# Patient Record
Sex: Female | Born: 1990 | Race: Black or African American | Hispanic: No | Marital: Single | Smoking: Former smoker
Health system: Southern US, Community
[De-identification: ages and names within clinical notes are randomized; demographics above are authoritative.]

## PROBLEM LIST (undated history)

## (undated) ENCOUNTER — Inpatient Hospital Stay (HOSPITAL_COMMUNITY): Payer: Self-pay

## (undated) DIAGNOSIS — R51 Headache: Secondary | ICD-10-CM

## (undated) DIAGNOSIS — K529 Noninfective gastroenteritis and colitis, unspecified: Secondary | ICD-10-CM

## (undated) DIAGNOSIS — R519 Headache, unspecified: Secondary | ICD-10-CM

## (undated) HISTORY — PX: NO PAST SURGERIES: SHX2092

---

## 2014-04-25 NOTE — L&D Delivery Note (Cosign Needed)
Delivery Note At 4:16 AM a viable female was delivered via Vaginal, Spontaneous Delivery (Presentation: Right Occiput Anterior).  APGAR: 8, 9; weight 6 lb 2.6 oz (2795 g).  Infant dried at perineum; cord clamped and cut by mother of pt at perineum due to short cord length. Once cord cut, infant to pt's abd and stimulated. Placenta status: Intact, Spontaneous.  Cord: 3 vessels with the following complications: Short cord subjectively; approx 12" in length.  Anesthesia: None  Episiotomy: None Lacerations: None Est. Blood Loss (mL): 200  Mom to postpartum.  Baby to Couplet care / Skin to Skin.   Cam HaiSHAW, Mikyla Schachter CNM 02/19/2015, 4:29 AM

## 2014-10-23 ENCOUNTER — Other Ambulatory Visit (HOSPITAL_COMMUNITY): Payer: Self-pay | Admitting: Physician Assistant

## 2014-10-23 DIAGNOSIS — Z3689 Encounter for other specified antenatal screening: Secondary | ICD-10-CM

## 2014-10-23 DIAGNOSIS — Z3A23 23 weeks gestation of pregnancy: Secondary | ICD-10-CM

## 2014-10-23 LAB — OB RESULTS CONSOLE HEPATITIS B SURFACE ANTIGEN: Hepatitis B Surface Ag: NEGATIVE

## 2014-10-23 LAB — OB RESULTS CONSOLE ABO/RH: RH Type: POSITIVE

## 2014-10-23 LAB — OB RESULTS CONSOLE HIV ANTIBODY (ROUTINE TESTING): HIV: NONREACTIVE

## 2014-10-23 LAB — OB RESULTS CONSOLE RPR: RPR: NONREACTIVE

## 2014-10-23 LAB — OB RESULTS CONSOLE RUBELLA ANTIBODY, IGM: RUBELLA: IMMUNE

## 2014-10-23 LAB — OB RESULTS CONSOLE ANTIBODY SCREEN: ANTIBODY SCREEN: NEGATIVE

## 2014-10-31 ENCOUNTER — Encounter (HOSPITAL_COMMUNITY): Payer: Self-pay

## 2014-10-31 ENCOUNTER — Other Ambulatory Visit (HOSPITAL_COMMUNITY): Payer: Self-pay | Admitting: Physician Assistant

## 2014-10-31 ENCOUNTER — Inpatient Hospital Stay (HOSPITAL_COMMUNITY)
Admission: AD | Admit: 2014-10-31 | Discharge: 2014-10-31 | Disposition: A | Discharge status: Home / Self Care | Payer: Medicaid Other | Source: Ambulatory Visit | Attending: Obstetrics and Gynecology | Admitting: Obstetrics and Gynecology

## 2014-10-31 ENCOUNTER — Ambulatory Visit (HOSPITAL_COMMUNITY)
Admission: RE | Admit: 2014-10-31 | Discharge: 2014-10-31 | Disposition: A | Discharge status: Home / Self Care | Payer: Medicaid Other | Source: Ambulatory Visit | Attending: Physician Assistant | Admitting: Physician Assistant

## 2014-10-31 DIAGNOSIS — Z3A23 23 weeks gestation of pregnancy: Secondary | ICD-10-CM

## 2014-10-31 DIAGNOSIS — Z3689 Encounter for other specified antenatal screening: Secondary | ICD-10-CM

## 2014-10-31 DIAGNOSIS — Z36 Encounter for antenatal screening of mother: Secondary | ICD-10-CM | POA: Diagnosis not present

## 2014-10-31 DIAGNOSIS — O219 Vomiting of pregnancy, unspecified: Secondary | ICD-10-CM | POA: Diagnosis not present

## 2014-10-31 DIAGNOSIS — O283 Abnormal ultrasonic finding on antenatal screening of mother: Secondary | ICD-10-CM | POA: Insufficient documentation

## 2014-10-31 DIAGNOSIS — O212 Late vomiting of pregnancy: Secondary | ICD-10-CM | POA: Diagnosis not present

## 2014-10-31 LAB — URINALYSIS, ROUTINE W REFLEX MICROSCOPIC
BILIRUBIN URINE: NEGATIVE
Glucose, UA: NEGATIVE mg/dL
Hgb urine dipstick: NEGATIVE
Ketones, ur: 40 mg/dL — AB
LEUKOCYTES UA: NEGATIVE
NITRITE: NEGATIVE
Protein, ur: NEGATIVE mg/dL
Specific Gravity, Urine: >1.03 — ABNORMAL HIGH (ref 1.005–1.030)
Urobilinogen, UA: 1 mg/dL (ref 0.0–1.0)
pH: 6 (ref 5.0–8.0)

## 2014-10-31 MED ORDER — PROMETHAZINE HCL 25 MG PO TABS: 25.0000 mg | ORAL_TABLET | Freq: Four times a day (QID) | ORAL | Status: DC | PRN

## 2014-10-31 NOTE — Discharge Instructions (Signed)

## 2014-10-31 NOTE — MAU Note (Signed)
Pt presents via EMS complaining of vomiting. States she vomited 4 times this am and has not vomited since calling EMS. States the vomiting is normal for her but was concerned about the blood. Denies vaginal bleeding or discharge. Reports good fetal movement. Denies pain

## 2014-10-31 NOTE — MAU Provider Note (Signed)
  History    Casey Quinn is a 24 y.o. [phone] at 3837w6d presenting to MAU with c/o emesis. Pt had four episodes of emesis over 1h: 1. clear liquid, 2. yellow liquid,  3. liquid with two dime sized chunks of dark blood, 4. liquid with small streaks of dark blood. Endorses sweating, "feeling hot and tired", and nausea 15min prior to and during episodes. Pt had not eaten all day and was seen at clinic this morning for regular prenatal care. She denies problems this pregnancy or in previous pregnancies. Denies other medical problems, specifically no h/o or S/S of GERD or gastric ulcers. Denies vaginal bleeding, LOF, or contractions. No change in fetal movements.   CSN: [id number]  Arrival date and time: 10/31/14 1221   None     Chief Complaint  Patient presents with  . Emesis   HPI   History reviewed. No pertinent past medical history.  History reviewed. No pertinent past surgical history.  History reviewed. No pertinent family history.  History  Substance Use Topics  . Smoking status: Never Smoker   . Smokeless tobacco: Not on file  . Alcohol Use: No    Allergies: No Known Allergies  No prescriptions prior to admission    Review of Systems  Constitutional: Positive for malaise/fatigue and diaphoresis. Negative for fever and chills.  HENT: Negative for hearing loss and tinnitus.   Eyes: Negative for blurred vision and double vision.  Respiratory: Negative for shortness of breath.   Cardiovascular: Negative for chest pain and palpitations.  Gastrointestinal: Positive for nausea and vomiting. Negative for heartburn, abdominal pain, diarrhea, constipation and blood in stool.  Genitourinary: Negative for dysuria and frequency.   Physical Exam  fht- Category 1. Reassuring. UC- No activity.  Blood pressure 109/70, pulse 86, temperature 98 F (36.7 C), temperature source Oral, resp. rate 18.  Physical Exam  Constitutional: She is oriented to person, place, and time. She appears  well-developed and well-nourished. No distress.  HENT:  Head: Normocephalic.  Cardiovascular: Normal rate, regular rhythm, normal heart sounds and intact distal pulses.  Exam reveals no gallop and no friction rub.   No murmur heard. Respiratory: Effort normal and breath sounds normal. No respiratory distress. She has no wheezes. She has no rales.  GI: Soft. Bowel sounds are normal. She exhibits no distension and no mass. There is no tenderness. There is no guarding.  Neurological: She is alert and oriented to person, place, and time.  Skin: Skin is warm and dry. She is not diaphoretic.    MAU Course  Procedures  MDM   Assessment and Plan  Nausea and vomiting in pregnancy.  Phenergan 25mg  po and d/c to home.   Arrington Bencomo Grissett 10/31/2014, 1:42 PM

## 2014-11-11 ENCOUNTER — Ambulatory Visit (HOSPITAL_COMMUNITY): Payer: Medicaid Other

## 2014-11-11 ENCOUNTER — Other Ambulatory Visit (HOSPITAL_COMMUNITY): Payer: Medicaid Other

## 2014-11-20 ENCOUNTER — Other Ambulatory Visit (HOSPITAL_COMMUNITY): Payer: Self-pay | Admitting: Physician Assistant

## 2014-12-16 LAB — OB RESULTS CONSOLE GC/CHLAMYDIA
CHLAMYDIA, DNA PROBE: NEGATIVE
GC PROBE AMP, GENITAL: NEGATIVE

## 2014-12-24 ENCOUNTER — Encounter (HOSPITAL_COMMUNITY): Payer: Self-pay | Admitting: *Deleted

## 2014-12-24 ENCOUNTER — Inpatient Hospital Stay (HOSPITAL_COMMUNITY)
Admission: AD | Admit: 2014-12-24 | Discharge: 2014-12-24 | Disposition: A | Discharge status: Home / Self Care | Payer: Medicaid Other | Source: Ambulatory Visit | Attending: Obstetrics & Gynecology | Admitting: Obstetrics & Gynecology

## 2014-12-24 DIAGNOSIS — Z87891 Personal history of nicotine dependence: Secondary | ICD-10-CM | POA: Diagnosis not present

## 2014-12-24 DIAGNOSIS — N760 Acute vaginitis: Secondary | ICD-10-CM | POA: Diagnosis not present

## 2014-12-24 DIAGNOSIS — N949 Unspecified condition associated with female genital organs and menstrual cycle: Secondary | ICD-10-CM

## 2014-12-24 DIAGNOSIS — R109 Unspecified abdominal pain: Secondary | ICD-10-CM | POA: Diagnosis present

## 2014-12-24 DIAGNOSIS — O23593 Infection of other part of genital tract in pregnancy, third trimester: Secondary | ICD-10-CM

## 2014-12-24 DIAGNOSIS — R102 Pelvic and perineal pain: Secondary | ICD-10-CM | POA: Insufficient documentation

## 2014-12-24 DIAGNOSIS — Z3A31 31 weeks gestation of pregnancy: Secondary | ICD-10-CM | POA: Insufficient documentation

## 2014-12-24 DIAGNOSIS — O26893 Other specified pregnancy related conditions, third trimester: Secondary | ICD-10-CM

## 2014-12-24 LAB — WET PREP, GENITAL
Clue Cells Wet Prep HPF POC: NONE SEEN
Trich, Wet Prep: NONE SEEN
YEAST WET PREP: NONE SEEN

## 2014-12-24 LAB — URINALYSIS, ROUTINE W REFLEX MICROSCOPIC
BILIRUBIN URINE: NEGATIVE
GLUCOSE, UA: NEGATIVE mg/dL
Hgb urine dipstick: NEGATIVE
Ketones, ur: NEGATIVE mg/dL
Leukocytes, UA: NEGATIVE
Nitrite: NEGATIVE
PROTEIN: NEGATIVE mg/dL
Specific Gravity, Urine: 1.015 (ref 1.005–1.030)
UROBILINOGEN UA: 1 mg/dL (ref 0.0–1.0)
pH: 7 (ref 5.0–8.0)

## 2014-12-24 MED ORDER — TERCONAZOLE 0.4 % VA CREA: 1.0000 | TOPICAL_CREAM | Freq: Every day | VAGINAL | Status: DC

## 2014-12-24 NOTE — MAU Provider Note (Signed)
History     CSN: [id number]  Arrival date and time: 12/24/14 1228   First Provider Initiated Contact with Patient 12/24/14 1352      Chief Complaint  Patient presents with  . Abdominal Pain   HPI   Ms.Casey Quinn is a 24 y.o. female [phone] at [redacted]w[redacted]d presenting to MAU with abdominal pain, vaginal pressure and vaginal itching. The vaginal pressure started last night; she has been on her feet a lot during the day. She has had no recent intercourse.  The vaginal itching is constant, she feels that the itching is on the inside of her vagina. She has not tried anything over the counter. She denies abnormal discharge, however she does attest to white vaginal discharge.   She has not been wearing a pregnancy support belt. She has had to stop and take breaks after walking for periods of time because the vaginal pressure is so intense.  She has no history of preterm labor or preterm deliveries. This pregnancy has been uncomplicated.   Denies bleeding or leaking of fluid  + fetal movement.   OB History    Gravida Para Term Preterm AB TAB SAB Ectopic Multiple Living   4 3 3       3       History reviewed. No pertinent past medical history.  History reviewed. No pertinent past surgical history.  History reviewed. No pertinent family history.  Social History  Substance Use Topics  . Smoking status: Former Games developer  . Smokeless tobacco: None  . Alcohol Use: No    Allergies: No Known Allergies  Prescriptions prior to admission  Medication Sig Dispense Refill Last Dose  . Pediatric Multivit-Minerals-C (FLINTSTONES GUMMIES PO) Take 3 each by mouth daily.   Past Week at Unknown time  . promethazine (PHENERGAN) 25 MG tablet Take 1 tablet (25 mg total) by mouth every 6 (six) hours as needed for nausea or vomiting. (Patient not taking: Reported on 12/24/2014) 30 tablet 0 Not Taking at Unknown time   Results for orders placed or performed during the hospital encounter of 12/24/14 (from the  past 48 hour(s))  Urinalysis, Routine w reflex microscopic (not at [facility])     Status: None   Collection Time: 12/24/14 12:45 PM  Result Value Ref Range   Color, Urine YELLOW YELLOW   APPearance CLEAR CLEAR   Specific Gravity, Urine 1.015 1.005 - 1.030   pH 7.0 5.0 - 8.0   Glucose, UA NEGATIVE NEGATIVE mg/dL   Hgb urine dipstick NEGATIVE NEGATIVE   Bilirubin Urine NEGATIVE NEGATIVE   Ketones, ur NEGATIVE NEGATIVE mg/dL   Protein, ur NEGATIVE NEGATIVE mg/dL   Urobilinogen, UA 1.0 0.0 - 1.0 mg/dL   Nitrite NEGATIVE NEGATIVE   Leukocytes, UA NEGATIVE NEGATIVE    Comment: MICROSCOPIC NOT DONE ON URINES WITH NEGATIVE PROTEIN, BLOOD, LEUKOCYTES, NITRITE, OR GLUCOSE <1000 mg/dL.  Wet prep, genital     Status: Abnormal   Collection Time: 12/24/14  2:23 PM  Result Value Ref Range   Yeast Wet Prep HPF POC NONE SEEN NONE SEEN   Trich, Wet Prep NONE SEEN NONE SEEN   Clue Cells Wet Prep HPF POC NONE SEEN NONE SEEN   WBC, Wet Prep HPF POC FEW (A) NONE SEEN    Comment: MANY BACTERIA SEEN    Review of Systems  Constitutional: Negative for fever and chills.  Gastrointestinal: Negative for nausea, vomiting and abdominal pain.  Genitourinary: Negative for dysuria, urgency, frequency and hematuria.  Denies vaginal discharge.    Physical Exam   Blood pressure 106/67, pulse 94, temperature 98.4 F (36.9 C), temperature source Oral, resp. rate 16, height  (1.6 m), weight 78.926 kg (174 lb).  Physical Exam  Constitutional: She is oriented to person, place, and time. She appears well-developed and well-nourished. No distress.  HENT:  Head: Normocephalic.  Eyes: Pupils are equal, round, and reactive to light.  Neck: Neck supple.  Respiratory: Effort normal.  GI: Soft.  Genitourinary:  Wet prep and GC collected without speculum. External genitalia normal without discharge or odor.  Bimanual exam: Cervix closed  Chaperone present for exam.  Musculoskeletal: Normal range of motion.   Neurological: She is alert and oriented to person, place, and time.  Skin: Skin is warm. She is not diaphoretic.  Psychiatric: Her behavior is normal.     Fetal Tracing: Baseline: 120 bpm Variability: Moderate  Accelerations: 15x15 Decelerations: quick variables  Toco: quiet    MAU Course  Procedures  None  MDM   Assessment and Plan   A:  1. Vaginitis affecting pregnancy in third trimester, antepartum   2. Pelvic pressure in pregnancy, antepartum, third trimester    P:  Discharge home in stable condition Pelvic rest Preterm labor precautions RX: Terazol Return to MAU if symptoms worsen   Follow up with the HD as scheduled  Pregnancy support belt recommended; discussed options on where to purchase.    Duane Lope, NP 12/24/2014 1:55 PM

## 2014-12-24 NOTE — MAU Note (Signed)
Feels a lot of pressure in vagina which started last night, denies vaginal bleeding, normal discharge no odor, bladder pressure, no problems during the pregnancy.

## 2014-12-25 LAB — GC/CHLAMYDIA PROBE AMP (~~LOC~~) NOT AT ARMC
Chlamydia: NEGATIVE
NEISSERIA GONORRHEA: NEGATIVE

## 2015-01-22 ENCOUNTER — Encounter (HOSPITAL_COMMUNITY): Payer: Self-pay | Admitting: *Deleted

## 2015-01-22 ENCOUNTER — Inpatient Hospital Stay (HOSPITAL_COMMUNITY)
Admission: AD | Admit: 2015-01-22 | Discharge: 2015-01-22 | Disposition: A | Discharge status: Home / Self Care | Payer: Medicaid Other | Source: Ambulatory Visit | Attending: Obstetrics & Gynecology | Admitting: Obstetrics & Gynecology

## 2015-01-22 DIAGNOSIS — N898 Other specified noninflammatory disorders of vagina: Secondary | ICD-10-CM | POA: Diagnosis present

## 2015-01-22 DIAGNOSIS — O26893 Other specified pregnancy related conditions, third trimester: Secondary | ICD-10-CM | POA: Insufficient documentation

## 2015-01-22 DIAGNOSIS — Z3A35 35 weeks gestation of pregnancy: Secondary | ICD-10-CM | POA: Diagnosis not present

## 2015-01-22 DIAGNOSIS — Z87891 Personal history of nicotine dependence: Secondary | ICD-10-CM | POA: Diagnosis not present

## 2015-01-22 HISTORY — DX: Headache: R51

## 2015-01-22 HISTORY — DX: Headache, unspecified: R51.9

## 2015-01-22 LAB — URINALYSIS, ROUTINE W REFLEX MICROSCOPIC
BILIRUBIN URINE: NEGATIVE
Glucose, UA: NEGATIVE mg/dL
Hgb urine dipstick: NEGATIVE
Ketones, ur: NEGATIVE mg/dL
Leukocytes, UA: NEGATIVE
Nitrite: NEGATIVE
Protein, ur: NEGATIVE mg/dL
UROBILINOGEN UA: 0.2 mg/dL (ref 0.0–1.0)
pH: 7 (ref 5.0–8.0)

## 2015-01-22 NOTE — MAU Provider Note (Signed)
History   [id number]   Chief Complaint  Patient presents with  . Vaginal Discharge    HPI Casey Quinn is a 24 y.o. female  [phone] at [redacted]w[redacted]d IUP here with report of passing thick, "snot" appearing discharge three days ago.  Denies contractions or vaginal bleeding.  Here to see if dilated.  Denies any problems in pregnancy.  [facility]    No LMP recorded. Patient is pregnant.  OB History  Gravida Para Term Preterm AB SAB TAB Ectopic Multiple Living  # Outcome Date GA Lbr Len/2nd Weight Sex Delivery Anes PTL Lv  4 Current           3 Term      Vag-Spont   Y  2 Term      Vag-Spont   Y  1 Term      Vag-Spont   Y      Past Medical History  Diagnosis Date  . Headache     Family History  Problem Relation Age of Onset  . Alcohol abuse Neg Hx   . Arthritis Neg Hx   . Asthma Neg Hx   . Birth defects Neg Hx   . Cancer Neg Hx   . COPD Neg Hx   . Depression Neg Hx   . Diabetes Neg Hx   . Drug abuse Neg Hx   . Early death Neg Hx   . Hearing loss Neg Hx   . Heart disease Neg Hx   . Hyperlipidemia Neg Hx   . Hypertension Neg Hx   . Kidney disease Neg Hx   . Learning disabilities Neg Hx   . Mental illness Neg Hx   . Mental retardation Neg Hx   . Miscarriages / Stillbirths Neg Hx   . Stroke Neg Hx   . Vision loss Neg Hx   . Varicose Veins Neg Hx     Social History   Social History  . Marital Status: Single    Spouse Name: N/A  . Number of Children: N/A  . Years of Education: N/A   Social History Main Topics  . Smoking status: Former Games developer  . Smokeless tobacco: None  . Alcohol Use: No  . Drug Use: No  . Sexual Activity: Not Currently    Birth Control/ Protection: None   Other Topics Concern  . None   Social History Narrative    No Known Allergies  No current facility-administered medications on file prior to encounter.   Current Outpatient Prescriptions on File Prior to Encounter  Medication Sig Dispense Refill   . Pediatric Multivit-Minerals-C (FLINTSTONES GUMMIES PO) Take 2 each by mouth daily.     . promethazine (PHENERGAN) 25 MG tablet Take 1 tablet (25 mg total) by mouth every 6 (six) hours as needed for nausea or vomiting. (Patient not taking: Reported on 12/24/2014) 30 tablet 0  . terconazole (TERAZOL 7) 0.4 % vaginal cream Place 1 applicator vaginally at bedtime. (Patient not taking: Reported on 01/22/2015) 45 g 0     Review of Systems  Gastrointestinal: Negative for abdominal pain.  Genitourinary: Positive for vaginal discharge. Negative for vaginal bleeding.  All other systems reviewed and are negative.    Physical Exam   Filed Vitals:   01/22/15 1527 01/22/15 1625  BP: 117/56 112/65  Pulse: 79 79  Temp: 98.5 F (36.9 C)   TempSrc: Oral   Resp: 18 18  Height:   (1.6 m)   Weight: 79.652 kg (175 lb 9.6 oz)     Physical Exam  Constitutional: She is oriented to person, place, and time. She appears well-developed and well-nourished.  HENT:  Head: Normocephalic.  Neck: Normal range of motion. Neck supple.  Cardiovascular: Normal rate, regular rhythm and normal heart sounds.   Respiratory: Effort normal and breath sounds normal. No respiratory distress.  GI: Soft. There is no tenderness.  Genitourinary: No bleeding in the vagina. Vaginal discharge (mucusy) found.  Musculoskeletal: Normal range of motion. She exhibits no edema.  Neurological: She is alert and oriented to person, place, and time.  Skin: Skin is warm and dry.   Dilation: Closed (Ext ox 1 cm) Exam by:: Margarita Mail, CNM  MAU Course  Procedures  Assessment and Plan  24 y.o. [phone] at [redacted]w[redacted]d IUP  Reactive NST  Plan: Discharge home Provided reassurance Showed visual of cervix - mucus plug Reviewed warning signs of pregnancy   Marlis Edelson, CNM 01/22/2015 5:26 PM

## 2015-01-22 NOTE — MAU Note (Addendum)
Pt reports she lost her mucus plug. Went to the  [facility] today and they sent her straight here. Pt denies any pain or contractions.

## 2015-01-22 NOTE — MAU Note (Signed)
Urine in lab 

## 2015-02-11 LAB — OB RESULTS CONSOLE GBS: GBS: NEGATIVE

## 2015-02-19 ENCOUNTER — Inpatient Hospital Stay (HOSPITAL_COMMUNITY): Payer: Medicaid Other

## 2015-02-19 ENCOUNTER — Encounter (HOSPITAL_COMMUNITY): Payer: Self-pay | Admitting: *Deleted

## 2015-02-19 ENCOUNTER — Inpatient Hospital Stay (HOSPITAL_COMMUNITY)
Admission: AD | Admit: 2015-02-19 | Discharge: 2015-02-20 | DRG: 775 | Disposition: A | Discharge status: Home / Self Care | Payer: Medicaid Other | Source: Ambulatory Visit | Attending: Obstetrics & Gynecology | Admitting: Obstetrics & Gynecology

## 2015-02-19 DIAGNOSIS — O9902 Anemia complicating childbirth: Secondary | ICD-10-CM | POA: Diagnosis present

## 2015-02-19 DIAGNOSIS — Z87891 Personal history of nicotine dependence: Secondary | ICD-10-CM

## 2015-02-19 DIAGNOSIS — Z3A39 39 weeks gestation of pregnancy: Secondary | ICD-10-CM

## 2015-02-19 DIAGNOSIS — Z141 Cystic fibrosis carrier: Secondary | ICD-10-CM

## 2015-02-19 DIAGNOSIS — D649 Anemia, unspecified: Secondary | ICD-10-CM | POA: Diagnosis present

## 2015-02-19 DIAGNOSIS — IMO0001 Reserved for inherently not codable concepts without codable children: Secondary | ICD-10-CM

## 2015-02-19 DIAGNOSIS — O288 Other abnormal findings on antenatal screening of mother: Secondary | ICD-10-CM

## 2015-02-19 LAB — CBC
HCT: 28.7 % — ABNORMAL LOW (ref 36.0–46.0)
Hemoglobin: 9.3 g/dL — ABNORMAL LOW (ref 12.0–15.0)
MCH: 22.5 pg — AB (ref 26.0–34.0)
MCHC: 32.4 g/dL (ref 30.0–36.0)
MCV: 69.3 fL — ABNORMAL LOW (ref 78.0–100.0)
PLATELETS: 224 10*3/uL (ref 150–400)
RBC: 4.14 MIL/uL (ref 3.87–5.11)
RDW: 16.1 % — ABNORMAL HIGH (ref 11.5–15.5)
WBC: 14.3 10*3/uL — AB (ref 4.0–10.5)

## 2015-02-19 LAB — HIV ANTIBODY (ROUTINE TESTING W REFLEX): HIV Screen 4th Generation wRfx: NONREACTIVE

## 2015-02-19 LAB — RPR: RPR: NONREACTIVE

## 2015-02-19 MED ORDER — OXYCODONE-ACETAMINOPHEN 5-325 MG PO TABS: 1.0000 | ORAL_TABLET | ORAL | Status: DC | PRN

## 2015-02-19 MED ORDER — PRENATAL MULTIVITAMIN CH
1.0000 | ORAL_TABLET | Freq: Every day | ORAL | Status: DC
  Administered 2015-02-19 – 2015-02-20 (×2): 1 via ORAL
  Filled 2015-02-19 (×2): qty 1

## 2015-02-19 MED ORDER — LIDOCAINE HCL (PF) 1 % IJ SOLN
30.0000 mL | INTRAMUSCULAR | Status: DC | PRN
Start: 2015-02-19 — End: 2015-02-19
  Filled 2015-02-19: qty 30

## 2015-02-19 MED ORDER — EPHEDRINE 5 MG/ML INJ
10.0000 mg | INTRAVENOUS | Status: DC | PRN
  Filled 2015-02-19: qty 2

## 2015-02-19 MED ORDER — LANOLIN HYDROUS EX OINT: TOPICAL_OINTMENT | CUTANEOUS | Status: DC | PRN

## 2015-02-19 MED ORDER — ONDANSETRON HCL 4 MG PO TABS: 4.0000 mg | ORAL_TABLET | ORAL | Status: DC | PRN

## 2015-02-19 MED ORDER — IBUPROFEN 600 MG PO TABS
600.0000 mg | ORAL_TABLET | Freq: Four times a day (QID) | ORAL | Status: DC
  Administered 2015-02-19 – 2015-02-20 (×6): 600 mg via ORAL
  Filled 2015-02-19 (×6): qty 1

## 2015-02-19 MED ORDER — TETANUS-DIPHTH-ACELL PERTUSSIS 5-2.5-18.5 LF-MCG/0.5 IM SUSP: 0.5000 mL | Freq: Once | INTRAMUSCULAR | Status: DC

## 2015-02-19 MED ORDER — SIMETHICONE 80 MG PO CHEW: 80.0000 mg | CHEWABLE_TABLET | ORAL | Status: DC | PRN

## 2015-02-19 MED ORDER — ACETAMINOPHEN 325 MG PO TABS: 650.0000 mg | ORAL_TABLET | ORAL | Status: DC | PRN

## 2015-02-19 MED ORDER — LACTATED RINGERS IV SOLN
INTRAVENOUS | Status: DC
  Administered 2015-02-19: 04:00:00 via INTRAVENOUS

## 2015-02-19 MED ORDER — DIPHENHYDRAMINE HCL 50 MG/ML IJ SOLN
12.5000 mg | INTRAMUSCULAR | Status: DC | PRN
Start: 2015-02-19 — End: 2015-02-19

## 2015-02-19 MED ORDER — OXYCODONE-ACETAMINOPHEN 5-325 MG PO TABS: 2.0000 | ORAL_TABLET | ORAL | Status: DC | PRN

## 2015-02-19 MED ORDER — LACTATED RINGERS IV SOLN: 500.0000 mL | INTRAVENOUS | Status: DC | PRN

## 2015-02-19 MED ORDER — CITRIC ACID-SODIUM CITRATE 334-500 MG/5ML PO SOLN: 30.0000 mL | ORAL | Status: DC | PRN

## 2015-02-19 MED ORDER — ZOLPIDEM TARTRATE 5 MG PO TABS: 5.0000 mg | ORAL_TABLET | Freq: Every evening | ORAL | Status: DC | PRN

## 2015-02-19 MED ORDER — OXYCODONE-ACETAMINOPHEN 5-325 MG PO TABS
1.0000 | ORAL_TABLET | ORAL | Status: DC | PRN
  Administered 2015-02-19 – 2015-02-20 (×3): 1 via ORAL
  Filled 2015-02-19 (×3): qty 1

## 2015-02-19 MED ORDER — FENTANYL 2.5 MCG/ML BUPIVACAINE 1/10 % EPIDURAL INFUSION (WH - ANES): 14.0000 mL/h | INTRAMUSCULAR | Status: DC | PRN

## 2015-02-19 MED ORDER — ONDANSETRON HCL 4 MG/2ML IJ SOLN: 4.0000 mg | Freq: Four times a day (QID) | INTRAMUSCULAR | Status: DC | PRN

## 2015-02-19 MED ORDER — LACTATED RINGERS IV BOLUS (SEPSIS): 500.0000 mL | Freq: Once | INTRAVENOUS | Status: DC

## 2015-02-19 MED ORDER — PROMETHAZINE HCL 25 MG/ML IJ SOLN: 12.5000 mg | Freq: Once | INTRAMUSCULAR | Status: DC

## 2015-02-19 MED ORDER — OXYTOCIN BOLUS FROM INFUSION: 500.0000 mL | INTRAVENOUS | Status: DC

## 2015-02-19 MED ORDER — BENZOCAINE-MENTHOL 20-0.5 % EX AERO
1.0000 "application " | INHALATION_SPRAY | CUTANEOUS | Status: DC | PRN
  Administered 2015-02-19: 1 via TOPICAL
  Filled 2015-02-19: qty 56

## 2015-02-19 MED ORDER — WITCH HAZEL-GLYCERIN EX PADS: 1.0000 "application " | MEDICATED_PAD | CUTANEOUS | Status: DC | PRN

## 2015-02-19 MED ORDER — FENTANYL CITRATE (PF) 100 MCG/2ML IJ SOLN: 100.0000 ug | INTRAMUSCULAR | Status: DC | PRN

## 2015-02-19 MED ORDER — ONDANSETRON HCL 4 MG/2ML IJ SOLN: 4.0000 mg | INTRAMUSCULAR | Status: DC | PRN

## 2015-02-19 MED ORDER — PHENYLEPHRINE 40 MCG/ML (10ML) SYRINGE FOR IV PUSH (FOR BLOOD PRESSURE SUPPORT)
80.0000 ug | PREFILLED_SYRINGE | INTRAVENOUS | Status: DC | PRN
  Filled 2015-02-19: qty 2

## 2015-02-19 MED ORDER — DIBUCAINE 1 % RE OINT: 1.0000 "application " | TOPICAL_OINTMENT | RECTAL | Status: DC | PRN

## 2015-02-19 MED ORDER — SENNOSIDES-DOCUSATE SODIUM 8.6-50 MG PO TABS
2.0000 | ORAL_TABLET | ORAL | Status: DC
  Administered 2015-02-20: 2 via ORAL
  Filled 2015-02-19: qty 2

## 2015-02-19 MED ORDER — DIPHENHYDRAMINE HCL 25 MG PO CAPS: 25.0000 mg | ORAL_CAPSULE | Freq: Four times a day (QID) | ORAL | Status: DC | PRN

## 2015-02-19 MED ORDER — OXYTOCIN 40 UNITS IN LACTATED RINGERS INFUSION - SIMPLE MED
62.5000 mL/h | INTRAVENOUS | Status: DC
  Filled 2015-02-19: qty 1000

## 2015-02-19 NOTE — Lactation Note (Signed)
This note was copied from the chart of Casey Quinn. Lactation Consultation Note  Patient Name: Casey Quinn EAVWU'JToday's Date: 02/19/2015 Reason for consult: Initial assessment Mother is an experienced breastfeeding mother. She reports this baby has latched well and mother denies any concerns. She plans to exclusively breast feed and pump when she returns to work. Only plans to give formula once back at work if needed. Mom made aware of O/P services, breastfeeding support groups, community resources, and our phone # for post-discharge questions. Mother denies any lactation needs, encouraged to have nurses assess latch and patient to request assist as needed. Mother pleasant and agreeable.  Maternal Data Does the patient have breastfeeding experience prior to this delivery?: Yes  Feeding Feeding Type: Breast Fed Length of feed: 20 min  LATCH Score/Interventions                      Lactation Tools Discussed/Used Initiated by:: BDaly RN and Doran HeaterKelly Shemo, RN Date initiated:: 02/19/15   Consult Status Consult Status: PRN    Omar PersonDaly, Beverly M 02/19/2015, 12:45 PM

## 2015-02-19 NOTE — H&P (Signed)
Casey Quinn is a 24 y.o. female [phone] @ 39.5wks presenting for eval of labor. Denies leaking or bldg. 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 Headache    History reviewed. No pertinent past surgical history. Family History: family history is negative for Alcohol abuse, Arthritis, Asthma, Birth defects, Cancer, COPD, Depression, Diabetes, Drug abuse, Early death, Hearing loss, Heart disease, Hyperlipidemia, Hypertension, Kidney disease, Learning disabilities, Mental illness, Mental retardation, Miscarriages / Stillbirths, Stroke, Vision loss, and Varicose Veins. Social History:  reports that she has quit smoking. She does not have any smokeless tobacco history on file. She reports that she does not drink alcohol or use illicit drugs.   Prenatal Transfer Tool  Maternal Diabetes: No Genetic Screening: Declined Maternal Ultrasounds/Referrals: Normal Fetal Ultrasounds or other Referrals:  None Maternal Substance Abuse:  No Significant Maternal Medications:  None Significant Maternal Lab Results:  Lab values include: Group B Strep negative Other Comments:  None  ROS  Dilation: 4 Effacement (%): 90 Station: -2 Exam by:: B Mosca Blood pressure 108/63, pulse 79, temperature 97.6 F (36.4 C), temperature source Oral, resp. rate 18, height 5\' 3"  (1.6 m), weight 81.92 kg (180 lb 9.6 oz). Exam Physical Exam  Constitutional: She is oriented to person, place, and time. She appears well-developed.  HENT:  Head: Normocephalic.  Neck: Normal range of motion.  Cardiovascular: Normal rate.   Respiratory: Effort normal.  GI:  EFM 130s, +accels, no decels, occ mi variables Ctx q 3-5 mins  Musculoskeletal: Normal range of motion.  Neurological:  She is alert and oriented to person, place, and time.  Skin: Skin is warm and dry.  Psychiatric: She has a normal mood and affect. Her behavior is normal. Thought content normal.    BPP obtained w/ initial nonreactive strip: 6/8 (no breathing) but is incomplete assessment due to pt's discomfort  Prenatal labs: ABO, Rh:  A+ Antibody:  neg Rubella:  imm RPR:   NR HBsAg:   neg HIV:   NR GBS:   negative  Assessment/Plan: IUP@39 .5wks Early active labor GBS neg  [facility] Expectant management Plans epidural Anticipate SVD   Cam HaiSHAW, Sebastain Fishbaugh CNM 02/19/2015, 3:23 AM

## 2015-02-19 NOTE — MAU Note (Signed)
Pt c/o contractions every 5 mins since 7pm. Denies LOF or vag bleeding. +FM. Cervix was 1-2cm on Monday.

## 2015-02-20 MED ORDER — IBUPROFEN 600 MG PO TABS: 600.0000 mg | ORAL_TABLET | Freq: Four times a day (QID) | ORAL | Status: DC

## 2015-02-20 NOTE — Discharge Instructions (Signed)

## 2015-02-20 NOTE — Progress Notes (Signed)
[facility]  CSW notes drug screens not sent on baby.  CSW reviewed MOB's PNR, which shows first PNV on 10/20/14 at approximately 22 weeks.  Therefore, CSW is screening out referral as it does not [facility].

## 2015-02-20 NOTE — Discharge Summary (Signed)
OB Discharge Summary  Patient Name: Casey Quinn DOB: 12-01-1990 MRN: [id number]  Date of admission: 02/19/2015 Delivering MD: Cam Hai D   Date of discharge: 02/20/2015  Admitting diagnosis: 39 WEEKS CTX Intrauterine pregnancy: [redacted]w[redacted]d     Secondary diagnosis:Active Problems:   Active labor at term   NSVD (normal spontaneous vaginal delivery)  Additional problems: late prenatal care, anemia, CF carrier, +chlam with neg TOC    Discharge diagnosis: Term Pregnancy Delivered                                                                     Post partum procedures:None  Augmentation: AROM  Complications: None  Hospital course:  Onset of Labor With Vaginal Delivery     24 y.o. yo [id number] at [redacted]w[redacted]d was admitted in Active Labor on 02/19/2015. Patient had an uncomplicated labor course as follows:  Membrane Rupture Time/Date: 4:04 AM ,02/19/2015   Intrapartum Procedures: Episiotomy: None [1]                                         Lacerations:  None [1]  Patient had a delivery of a Viable infant. 02/19/2015  Information for the patient's newborn:  Arakelian, Girl Yvanna [[id number]]  Delivery Method: Vaginal, Spontaneous Delivery (Filed from Delivery Summary)     Pateint had an uncomplicated postpartum course.  She is ambulating, tolerating a regular diet, passing flatus, and urinating well. Patient is discharged home in stable condition on No discharge date for patient encounter.Marland Kitchen    Physical exam  Filed Vitals:   02/19/15 0726 02/19/15 1126 02/19/15 1803 02/20/15 0552  BP: 112/57 118/73 114/67 121/63  Pulse: 73 66 97 78  Temp: 98.3 F (36.8 C) 97.8 F (36.6 C) 97.9 F (36.6 C) 98.4 F (36.9 C)  TempSrc: Oral Oral Oral Oral  Resp: Height:      Weight:       General: alert, cooperative and no distress Lochia: appropriate Uterine Fundus: firm Incision: N/A DVT Evaluation: No evidence of DVT seen on physical exam. Labs: Lab Results  Component Value  Date   WBC 14.3* 02/19/2015   HGB 9.3* 02/19/2015   HCT 28.7* 02/19/2015   MCV 69.3* 02/19/2015   PLT 224 02/19/2015   No flowsheet data found.  Discharge instruction: per After Visit Summary and "Baby and Me Booklet".  Medications:  Current facility-administered medications:  .  acetaminophen (TYLENOL) tablet 650 mg, 650 mg, Oral, Q4H PRN, Arabella Merles, CNM .  benzocaine-Menthol (DERMOPLAST) 20-0.5 % topical spray 1 application, 1 application, Topical, PRN, Arabella Merles, CNM, 1 application at 02/19/15 [phone] .  witch hazel-glycerin (TUCKS) pad 1 application, 1 application, Topical, PRN **AND** dibucaine (NUPERCAINAL) 1 % rectal ointment 1 application, 1 application, Rectal, PRN, Arabella Merles, CNM .  diphenhydrAMINE (BENADRYL) capsule 25 mg, 25 mg, Oral, Q6H PRN, Arabella Merles, CNM .  ibuprofen (ADVIL,MOTRIN) tablet 600 mg, 600 mg, Oral, 4 times per day, Arabella Merles, CNM, 600 mg at 02/20/15 0605 .  lanolin ointment, , Topical, PRN, Arabella Merles, CNM .  ondansetron (ZOFRAN) tablet 4  mg, 4 mg, Oral, Q4H PRN **OR** ondansetron (ZOFRAN) injection 4 mg, 4 mg, Intravenous, Q4H PRN, Arabella MerlesKimberly D Shaw, CNM .  oxyCODONE-acetaminophen (PERCOCET/ROXICET) 5-325 MG per tablet 1 tablet, 1 tablet, Oral, Q4H PRN, Arabella MerlesKimberly D Shaw, CNM, 1 tablet at 02/20/15 0605 .  oxyCODONE-acetaminophen (PERCOCET/ROXICET) 5-325 MG per tablet 2 tablet, 2 tablet, Oral, Q4H PRN, Arabella MerlesKimberly D Shaw, CNM .  prenatal multivitamin tablet 1 tablet, 1 tablet, Oral, Q1200, Arabella MerlesKimberly D Shaw, CNM, 1 tablet at 02/19/15 1122 .  senna-docusate (Senokot-S) tablet 2 tablet, 2 tablet, Oral, Q24H, Arabella MerlesKimberly D Shaw, CNM, 2 tablet at 02/20/15 0029 .  simethicone (MYLICON) chewable tablet 80 mg, 80 mg, Oral, PRN, Arabella MerlesKimberly D Shaw, CNM .  zolpidem (AMBIEN) tablet 5 mg, 5 mg, Oral, QHS PRN, Arabella MerlesKimberly D Shaw, CNM After Visit Meds:    Medication List    TAKE these medications        ibuprofen 600 MG tablet  Commonly known as:   ADVIL,MOTRIN  Take 1 tablet (600 mg total) by mouth every 6 (six) hours.        Diet: routine diet  Activity: Advance as tolerated. Pelvic rest for 6 weeks.   Outpatient follow up:6 weeks Follow up Appt:No future appointments. Follow up visit: No Follow-up on file.  Postpartum contraception: IUD Paragard  Newborn Data: Live born female  Birth Weight: 6 lb 2.6 oz (2795 g) APGAR: 8, 9  Baby Feeding: Breast Disposition:home with mother or rooming for possible need for phototherapy   02/20/2015 Jamelle HaringHillary M Fitzgerald, MD  [facility], PGY-1   I have seen and examined this patient and agree the above assessment.  Respiratory effort normal, lochia appropriate, legs negative,  pain level normal.  CRESENZO-DISHMAN,Flavius Repsher 02/26/2015 8:45 AM

## 2015-02-23 LAB — TYPE AND SCREEN
ABO/RH(D): A POS
ANTIBODY SCREEN: POSITIVE
DAT, IgG: NEGATIVE
PT AG TYPE: NEGATIVE
Unit division: 0
Unit division: 0

## 2016-02-11 ENCOUNTER — Encounter (HOSPITAL_COMMUNITY): Payer: Self-pay | Admitting: *Deleted

## 2016-02-11 ENCOUNTER — Inpatient Hospital Stay (HOSPITAL_COMMUNITY)
Admission: AD | Admit: 2016-02-11 | Discharge: 2016-02-11 | Disposition: A | Discharge status: Home / Self Care | Payer: Medicaid Other | Source: Ambulatory Visit | Attending: Obstetrics & Gynecology | Admitting: Obstetrics & Gynecology

## 2016-02-11 DIAGNOSIS — O99511 Diseases of the respiratory system complicating pregnancy, first trimester: Secondary | ICD-10-CM | POA: Diagnosis not present

## 2016-02-11 DIAGNOSIS — Z87891 Personal history of nicotine dependence: Secondary | ICD-10-CM | POA: Diagnosis not present

## 2016-02-11 DIAGNOSIS — J069 Acute upper respiratory infection, unspecified: Secondary | ICD-10-CM | POA: Insufficient documentation

## 2016-02-11 DIAGNOSIS — K219 Gastro-esophageal reflux disease without esophagitis: Secondary | ICD-10-CM | POA: Diagnosis not present

## 2016-02-11 DIAGNOSIS — Z3491 Encounter for supervision of normal pregnancy, unspecified, first trimester: Secondary | ICD-10-CM

## 2016-02-11 DIAGNOSIS — O99611 Diseases of the digestive system complicating pregnancy, first trimester: Secondary | ICD-10-CM | POA: Insufficient documentation

## 2016-02-11 DIAGNOSIS — R079 Chest pain, unspecified: Secondary | ICD-10-CM | POA: Diagnosis present

## 2016-02-11 DIAGNOSIS — Z3A08 8 weeks gestation of pregnancy: Secondary | ICD-10-CM | POA: Insufficient documentation

## 2016-02-11 LAB — URINALYSIS, ROUTINE W REFLEX MICROSCOPIC
BILIRUBIN URINE: NEGATIVE
Glucose, UA: NEGATIVE mg/dL
Hgb urine dipstick: NEGATIVE
KETONES UR: NEGATIVE mg/dL
LEUKOCYTES UA: NEGATIVE
NITRITE: NEGATIVE
Protein, ur: NEGATIVE mg/dL
SPECIFIC GRAVITY, URINE: 1.02 (ref 1.005–1.030)
pH: 7 (ref 5.0–8.0)

## 2016-02-11 LAB — POCT PREGNANCY, URINE: Preg Test, Ur: POSITIVE — AB

## 2016-02-11 MED ORDER — GI COCKTAIL ~~LOC~~
30.0000 mL | ORAL | Status: AC
  Administered 2016-02-11: 30 mL via ORAL
  Filled 2016-02-11: qty 30

## 2016-02-11 MED ORDER — GUAIFENESIN ER 600 MG PO TB12: 1200.0000 mg | ORAL_TABLET | Freq: Two times a day (BID) | ORAL | 0 refills | Status: DC

## 2016-02-11 MED ORDER — GUAIFENESIN ER 600 MG PO TB12
1200.0000 mg | ORAL_TABLET | Freq: Once | ORAL | Status: AC
  Administered 2016-02-11: 1200 mg via ORAL
  Filled 2016-02-11: qty 2

## 2016-02-11 MED ORDER — BENZONATATE 100 MG PO CAPS: 200.0000 mg | ORAL_CAPSULE | Freq: Three times a day (TID) | ORAL | 0 refills | Status: DC | PRN

## 2016-02-11 NOTE — MAU Provider Note (Signed)
Chief Complaint: Chest Pain; Sore Throat; and Emesis   First Provider Initiated Contact with Patient 02/11/16 1530      SUBJECTIVE HPI: Casey Quinn is a 25 y.o. [id number] at 492w1d by LMP who presents to maternity admissions reporting sore throat, runny nose, and cough x 3 days with vomiting x 1 today that was blood tinged and with mucous. After vomiting, she reports burning chest pain that has resolved. Now, her throat is still sore but also burning, which is new since the vomiting. She reports she is not taking any medications for her symptoms because she is not sure what is safe in pregnancy. She denies nausea during the pregnancy other than the one episode today.  She came to MAU because the bleeding in her vomit and pain after vomiting made her worry that something was wrong.   She denies abdominal pain, vaginal bleeding, vaginal itching/burning, urinary symptoms, h/a, dizziness, n/v, or fever/chills.     HPI  Past Medical History:  Diagnosis Date  . Headache    History reviewed. No pertinent surgical history. Social History   Social History  . Marital status: Single    Spouse name: N/A  . Number of children: N/A  . Years of education: N/A   Occupational History  . Not on file.   Social History Main Topics  . Smoking status: Former Games developermoker  . Smokeless tobacco: Never Used  . Alcohol use No  . Drug use: No  . Sexual activity: Not Currently    Birth control/ protection: None   Other Topics Concern  . Not on file   Social History Narrative  . No narrative on file   No current facility-administered medications on file prior to encounter.    Current Outpatient Prescriptions on File Prior to Encounter  Medication Sig Dispense Refill  . ibuprofen (ADVIL,MOTRIN) 600 MG tablet Take 1 tablet (600 mg total) by mouth every 6 (six) hours. (Patient not taking: Reported on 02/11/2016) 30 tablet 0   No Known Allergies  ROS:  Review of Systems  Constitutional: Negative for chills,  fatigue and fever.  HENT: Positive for congestion, postnasal drip, rhinorrhea and sore throat.   Respiratory: Positive for cough and shortness of breath.   Cardiovascular: Negative for chest pain.  Gastrointestinal: Positive for vomiting. Negative for nausea.  Genitourinary: Negative for difficulty urinating, dysuria, flank pain, pelvic pain, vaginal bleeding, vaginal discharge and vaginal pain.  Neurological: Negative for dizziness and headaches.  Psychiatric/Behavioral: Negative.      I have reviewed patient's Past Medical Hx, Surgical Hx, Family Hx, Social Hx, medications and allergies.   Physical Exam  Patient Vitals for the past 24 hrs:  BP Temp Temp src Resp SpO2  02/11/16 1447 113/58 97.4 F (36.3 C) Oral (!) 103 100 %   Constitutional: Well-developed, well-nourished female in no acute distress.  Cardiovascular: normal rate Respiratory: normal effort GI: Abd soft, non-tender. Pos BS x 4 MS: Extremities nontender, no edema, normal ROM Neurologic: Alert and oriented x 4.  GU: Neg CVAT.  PELVIC EXAM: Deferred   LAB RESULTS Results for orders placed or performed during the hospital encounter of 02/11/16 (from the past 24 hour(s))  Urinalysis, Routine w reflex microscopic (not [facility])     Status: None   Collection Time: 02/11/16  2:35 PM  Result Value Ref Range   Color, Urine YELLOW YELLOW   APPearance CLEAR CLEAR   Specific Gravity, Urine 1.020 1.005 - 1.030   pH 7.0 5.0 - 8.0  Glucose, UA NEGATIVE NEGATIVE mg/dL   Hgb urine dipstick NEGATIVE NEGATIVE   Bilirubin Urine NEGATIVE NEGATIVE   Ketones, ur NEGATIVE NEGATIVE mg/dL   Protein, ur NEGATIVE NEGATIVE mg/dL   Nitrite NEGATIVE NEGATIVE   Leukocytes, UA NEGATIVE NEGATIVE  Pregnancy, urine POC     Status: Abnormal   Collection Time: 02/11/16  2:49 PM  Result Value Ref Range   Preg Test, Ur POSITIVE (A) NEGATIVE    --/--/A POS (10/27 0335)  IMAGING Bedside US with Wynelle Bourgeois, CNM, and Misty Stanley  Leftwich-Kirby,CNM done with confirmed viable IUP with CRL measuing [redacted]w[redacted]d, c/w LMP dating.    MAU Management/MDM: Ordered labs and reviewed results.  [place] confirms IUP with no abnormal findings.  Blood with emesis likely from acid reflux/esophagitis from vomiting.  Will treat respiratory symptoms to decrease postnasal drip.  GI Cocktail given in MAU with complete resolution of throat/chest pain.  Mucinex 1200 mg PO given in MAU.  Rx for Mucinex 1200 mg PO BID PRN and Tessalon Perles 200 mg TID PRN.  Pt to f/u with early prenatal care, will call [facility] for appointment, given, list of providers given in case needed.  Pt stable at time of discharge.  ASSESSMENT 1. Acute upper respiratory infection   2. Normal intrauterine pregnancy on prenatal ultrasound in first trimester   3. Mild acid reflux     PLAN Discharge home   Medication List    STOP taking these medications   ibuprofen 600 MG tablet Commonly known as:  ADVIL,MOTRIN     TAKE these medications   guaiFENesin 600 MG 12 hr tablet Commonly known as:  MUCINEX Take 2 tablets (1,200 mg total) by mouth 2 (two) times daily.      Follow-up Information    Center for [facility]. Schedule an appointment as soon as possible for a visit today.   Specialty:  Obstetrics and Gynecology Why:  Or see attached list of providers Contact information: [address] [place] [address] [phone]          Sharen Counter Certified Nurse-Midwife 02/11/2016  4:28 PM

## 2016-02-11 NOTE — Discharge Instructions (Signed)
Upper Respiratory Infection, Adult Most upper respiratory infections (URIs) are a viral infection of the air passages leading to the lungs. A URI affects the nose, throat, and upper air passages. The most common type of URI is nasopharyngitis and is typically referred to as "the common cold." URIs run their course and usually go away on their own. Most of the time, a URI does not require medical attention, but sometimes a bacterial infection in the upper airways can follow a viral infection. This is called a secondary infection. Sinus and middle ear infections are common types of secondary upper respiratory infections. Bacterial pneumonia can also complicate a URI. A URI can worsen asthma and chronic obstructive pulmonary disease (COPD). Sometimes, these complications can require emergency medical care and may be life threatening.  CAUSES Almost all URIs are caused by viruses. A virus is a type of germ and can spread from one person to another.  RISKS FACTORS You may be at risk for a URI if:   You smoke.   You have chronic heart or lung disease.  You have a weakened defense (immune) system.   You are very young or very old.   You have nasal allergies or asthma.  You work in crowded or poorly ventilated areas.  You work in health care facilities or schools. SIGNS AND SYMPTOMS  Symptoms typically develop 2-3 days after you come in contact with a cold virus. Most viral URIs last 7-10 days. However, viral URIs from the influenza virus (flu virus) can last 14-18 days and are typically more severe. Symptoms may include:   Runny or stuffy (congested) nose.   Sneezing.   Cough.   Sore throat.   Headache.   Fatigue.   Fever.   Loss of appetite.   Pain in your forehead, behind your eyes, and over your cheekbones (sinus pain).  Muscle aches.  DIAGNOSIS  Your health care provider may diagnose a URI by:  Physical exam.  Tests to check that your symptoms are not due to  another condition such as:  Strep throat.  Sinusitis.  Pneumonia.  Asthma. TREATMENT  A URI goes away on its own with time. It cannot be cured with medicines, but medicines may be prescribed or recommended to relieve symptoms. Medicines may help:  Reduce your fever.  Reduce your cough.  Relieve nasal congestion. HOME CARE INSTRUCTIONS   Take medicines only as directed by your health care provider.   Gargle warm saltwater or take cough drops to comfort your throat as directed by your health care provider.  Use a warm mist humidifier or inhale steam from a shower to increase air moisture. This may make it easier to breathe.  Drink enough fluid to keep your urine clear or pale yellow.   Eat soups and other clear broths and maintain good nutrition.   Rest as needed.   Return to work when your temperature has returned to normal or as your health care provider advises. You may need to stay home longer to avoid infecting others. You can also use a face mask and careful hand washing to prevent spread of the virus.  Increase the usage of your inhaler if you have asthma.   Do not use any tobacco products, including cigarettes, chewing tobacco, or electronic cigarettes. If you need help quitting, ask your health care provider. PREVENTION  The best way to protect yourself from getting a cold is to practice good hygiene.   Avoid oral or hand contact with people with cold  symptoms.   Wash your hands often if contact occurs.  There is no clear evidence that vitamin C, vitamin E, echinacea, or exercise reduces the chance of developing a cold. However, it is always recommended to get plenty of rest, exercise, and practice good nutrition.  SEEK MEDICAL CARE IF:   You are getting worse rather than better.   Your symptoms are not controlled by medicine.   You have chills.  You have worsening shortness of breath.  You have brown or red mucus.  You have yellow or brown nasal  discharge.  You have pain in your face, especially when you bend forward.  You have a fever.  You have swollen neck glands.  You have pain while swallowing.  You have white areas in the back of your throat. SEEK IMMEDIATE MEDICAL CARE IF:   You have severe or persistent:  Headache.  Ear pain.  Sinus pain.  Chest pain.  You have chronic lung disease and any of the following:  Wheezing.  Prolonged cough.  Coughing up blood.  A change in your usual mucus.  You have a stiff neck.  You have changes in your:  Vision.  Hearing.  Thinking.  Mood. MAKE SURE YOU:   Understand these instructions.  Will watch your condition.  Will get help right away if you are not doing well or get worse.   This information is not intended to replace advice given to you by your health care provider. Make sure you discuss any questions you have with your health care provider.   Document Released: 10/05/2000 Document Revised: 08/26/2014 Document Reviewed: 07/17/2013 Elsevier Interactive Patient Education 2016 Elsevier Inc.   Heartburn During Pregnancy Heartburn is a burning sensation in the chest caused by stomach acid backing up into the esophagus. Heartburn is common in pregnancy because a certain hormone (progesterone) is released when a woman is pregnant. The progesterone hormone may relax the valve that separates the esophagus from the stomach. This allows acid to go up into the esophagus, causing heartburn. Heartburn may also happen in pregnancy because the enlarging uterus pushes up on the stomach, which pushes more acid into the esophagus. This is especially true in the later stages of pregnancy. Heartburn problems usually go away after giving birth. CAUSES  Heartburn is caused by stomach acid backing up into the esophagus. During pregnancy, this may result from various things, including:   The progesterone hormone.  Changing hormone levels.  The growing uterus pushing  stomach acid upward.  Large meals.  Certain foods and drinks.  Exercise.  Increased acid production. SIGNS AND SYMPTOMS   Burning pain in the chest or lower throat.  Bitter taste in the mouth.  Coughing. DIAGNOSIS  Your health care provider will typically diagnose heartburn by taking a careful history of your concern. Blood tests may be done to check for a certain type of bacteria that is associated with heartburn. Sometimes, heartburn is diagnosed by prescribing a heartburn medicine to see if the symptoms improve. In some cases, a procedure called an endoscopy may be done. In this procedure, a tube with a light and a camera on the end (endoscope) is used to examine the esophagus and the stomach. TREATMENT  Treatment will vary depending on the severity of your symptoms. Your health care provider may recommend:  Over-the-counter medicines (antacids, acid reducers) for mild heartburn.  Prescription medicines to decrease stomach acid or to protect your stomach lining.  Certain changes in your diet.  Elevating the head of  your bed by putting blocks under the legs. This helps prevent stomach acid from backing up into the esophagus when you are lying down. HOME CARE INSTRUCTIONS   Only take over-the-counter or prescription medicines as directed by your health care provider.  Raise the head of your bed by putting blocks under the legs if instructed to do so by your health care provider. Sleeping with more pillows is not effective because it only changes the position of your head.  Do not exercise right after eating.  Avoid eating 2-3 hours before bed. Do not lie down right after eating.  Eat small meals throughout the day instead of three large meals.  Identify foods and beverages that make your symptoms worse and avoid them. Foods you may want to avoid include:  Peppers.  Chocolate.  High-fat foods, including fried foods.  Spicy foods.  Garlic and onions.  Citrus fruits,  including oranges, grapefruit, lemons, and limes.  Food containing tomatoes or tomato products.  Mint.  Carbonated and caffeinated drinks.  Vinegar. SEEK MEDICAL CARE IF:  You have abdominal pain of any kind.  You feel burning in your upper abdomen or chest, especially after eating or lying down.  You have nausea and vomiting.  Your stomach feels upset after you eat. SEEK IMMEDIATE MEDICAL CARE IF:   You have severe chest pain that goes down your arm or into your jaw or neck.  You feel sweaty, dizzy, or light-headed.  You become short of breath.  You vomit blood.  You have difficulty or pain with swallowing.  You have bloody or black, tarry stools.  You have episodes of heartburn more than 3 times a week, for more than 2 weeks. MAKE SURE YOU:  Understand these instructions.  Will watch your condition.  Will get help right away if you are not doing well or get worse.   This information is not intended to replace advice given to you by your health care provider. Make sure you discuss any questions you have with your health care provider.   Document Released: 04/08/2000 Document Revised: 05/02/2014 Document Reviewed: 11/28/2012 Elsevier Interactive Patient Education 2016 Elsevier [employer].   [facility] Providers    [facility] for [employer] at [facility]       Phone: [phone]  [facility] for [employer] at [place] Phone: [phone]  [facility] for [employer] at [place]  Phone: [phone]  Center for [facility] at [facility]  Phone: [phone]  Center for [facility] at [place]  Phone: [phone]  [facility]       Phone: [phone]  [facility]    Phone: [phone]   [facility] [place])    Phone: [phone]  [facility]    Phone: [phone]  [facility]    Phone:  [phone]  [facility]    Phone: [phone]  [facility]       Phone: [phone]   [facility]  Phone: [phone]  [facility]    Phone: [phone]  [facility]   Phone: [phone]  [facility]      Phone: [phone]  [facility]    Phone: [phone]

## 2016-02-11 NOTE — MAU Note (Signed)
Pt states she was having chest pain and sore throat since Monday.  Pt states that today she threw up bloody mucus.  Pt states it can be hard to breathe sometimes.

## 2016-02-11 NOTE — MAU Note (Signed)
Urine in lab 

## 2016-03-23 ENCOUNTER — Ambulatory Visit (INDEPENDENT_AMBULATORY_CARE_PROVIDER_SITE_OTHER): Payer: Medicaid Other | Admitting: Advanced Practice Midwife

## 2016-03-23 ENCOUNTER — Encounter: Payer: Self-pay | Admitting: Advanced Practice Midwife

## 2016-03-23 ENCOUNTER — Other Ambulatory Visit (HOSPITAL_COMMUNITY)
Admission: RE | Admit: 2016-03-23 | Discharge: 2016-03-23 | Disposition: A | Discharge status: Home / Self Care | Payer: Medicaid Other | Source: Ambulatory Visit | Attending: Advanced Practice Midwife | Admitting: Advanced Practice Midwife

## 2016-03-23 DIAGNOSIS — Z3482 Encounter for supervision of other normal pregnancy, second trimester: Secondary | ICD-10-CM

## 2016-03-23 DIAGNOSIS — Z113 Encounter for screening for infections with a predominantly sexual mode of transmission: Secondary | ICD-10-CM | POA: Insufficient documentation

## 2016-03-23 DIAGNOSIS — O361920 Maternal care for other isoimmunization, second trimester, not applicable or unspecified: Secondary | ICD-10-CM | POA: Diagnosis not present

## 2016-03-23 DIAGNOSIS — Z3490 Encounter for supervision of normal pregnancy, unspecified, unspecified trimester: Secondary | ICD-10-CM | POA: Insufficient documentation

## 2016-03-23 DIAGNOSIS — O09892 Supervision of other high risk pregnancies, second trimester: Secondary | ICD-10-CM | POA: Insufficient documentation

## 2016-03-23 DIAGNOSIS — O361990 Maternal care for other isoimmunization, unspecified trimester, not applicable or unspecified: Secondary | ICD-10-CM | POA: Insufficient documentation

## 2016-03-23 DIAGNOSIS — Z23 Encounter for immunization: Secondary | ICD-10-CM | POA: Diagnosis not present

## 2016-03-23 DIAGNOSIS — Z01419 Encounter for gynecological examination (general) (routine) without abnormal findings: Secondary | ICD-10-CM | POA: Diagnosis present

## 2016-03-23 DIAGNOSIS — Z3492 Encounter for supervision of normal pregnancy, unspecified, second trimester: Secondary | ICD-10-CM

## 2016-03-23 NOTE — Progress Notes (Signed)
  Subjective:    Casey Quinn is a [id number] 7069w0d being seen today for her first obstetrical visit.  Her obstetrical history is significant for closely spaced pregnancies (last a year ago), grand multigravida, Hx rapid labor. 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  Patient does intend to breast feed. Pregnancy history fully reviewed.  Patient reports no complaints.  Vitals:   03/23/16 0928  BP: 128/69  Pulse: 90  Weight: 186 lb (84.4 kg)    HISTORY: OB History  Gravida Para Term Preterm AB Living  5 4 4  0 0 4  SAB TAB Ectopic Multiple Live Births  0 0 0 0 4    # Outcome Date GA Lbr Len/2nd Weight Sex Delivery Anes PTL Lv  5 Current           4 Term 02/19/15 8080w5d 09:12 / 00:04 6 lb 2.6 oz (2.795 kg) F Vag-Spont None  LIV  3 Term 07/06/13 2888w0d  7 lb 4 oz (3.289 kg) M Vag-Spont  N LIV  2 Term 06/03/12 3450w0d  6 lb 9 oz (2.977 kg) M Vag-Spont  N LIV  1 Term 02/08/09 7166w0d  5 lb 15 oz (2.693 kg) F Vag-Spont  N LIV     Past Medical History:  Diagnosis Date  . Headache    Past Surgical History:  Procedure Laterality Date  . NO PAST SURGERIES     Family History  Problem Relation Age of Onset  . Cancer Sister      Exam    Uterus:     Pelvic Exam:    Perineum: No Hemorrhoids   Vulva: Bartholin's, Urethra, Skene's normal   Vagina:  normal discharge   pH:    Cervix: no cervical motion tenderness   Adnexa: no mass, fullness, tenderness   Bony Pelvis: gynecoid  System: Breast:  normal appearance, no masses or tenderness   Skin: normal coloration and turgor, no rashes    Neurologic: oriented   Extremities: normal strength, tone, and muscle mass   HEENT neck supple with midline trachea   Mouth/Teeth mucous membranes moist, pharynx normal without lesions   Neck supple   Cardiovascular: regular rate and rhythm   Respiratory:  appears well, vitals normal, no respiratory distress, acyanotic, normal RR, ear and throat exam is normal, neck free of mass or lymphadenopathy, chest clear, no wheezing,  crepitations, rhonchi, normal symmetric air entry   Abdomen: soft, non-tender; bowel sounds normal; no masses,  no organomegaly   Urinary: urethral meatus normal      Assessment:    Pregnancy: [id number] Patient Active Problem List   Diagnosis Date Noted  . Supervision of normal pregnancy 03/23/2016        Plan:     Initial labs drawn. Prenatal vitamins. Problem list reviewed and updated. Genetic Screening discussed Quad Screen: requested.  Ultrasound discussed; fetal survey: results reviewed.  Follow up in 4 weeks. 50% of 30 min visit spent on counseling and coordination of care.   Routines reviewed Dates confirmed in MAU with 8 week ultrasound Wants to participate in Bab[employer]cript[employer] program    [facility]Janice [facility] 03/23/2016

## 2016-03-23 NOTE — Patient Instructions (Signed)
Intrauterine Device Information Introduction An intrauterine device (IUD) is a medical device that gets inserted into the uterus to prevent pregnancy. It is a small, T-shaped device that has one or two nylon strings hanging down from it. The strings hang out of the lower part of the uterus (cervix) to allow for future IUD removal. There are two types of IUDs available:  Copper IUD. This type of IUD has copper wire wrapped around it. A copper IUD may last up to 10 years.  Hormone IUD. This type of IUD is made of plastic and contains the hormone progestin (synthetic progesterone). A hormone IUD may last 3 to 5 years. IUDs are inserted through the vagina and placed into the uterus with a minor medical procedure. How does the IUD work? Copper in the copper IUD prevents pregnancy by making the uterus and fallopian tubes produce a fluid that kills sperm. Synthetic progesterone in hormonal IUD prevents pregnancy by:  Thickening cervical mucus to prevent sperm from entering the uterus.  Thinning the uterine lining to prevent implantation of a fertilized egg.  Weakening or killing sperm that get into the uterus. What are the advantages of an IUD?  IUDs are highly effective, reversible, long-acting, and low-maintenance.  There are no estrogen-related side effects.  An IUD can be used when breastfeeding.  IUDs are not associated with weight gain.  Advantages of the copper IUD are that:  It works immediately after insertion.  It does not interfere with your body's natural hormones.  It can be used for 10 years.  Advantages of the hormone IUD are that:  If it is inserted within 7 days of your period starting, it works immediately after insertion. If the hormone IUD is inserted at any other time in your cycle, you will need to use a backup method of birth control for 7 days after insertion.  It can make menstrual periods lighter.  It can decrease menstrual cramping.  It can be used for 3  or 5 years. What are the disadvantages of an IUD?  The hormone IUD may cause irregular menstrual bleeding for a period of time after insertion.  The copper IUD can make your menstrual flow heavier and more painful.  You may experience some pain during insertion, and cramping and vaginal bleeding after insertion. How is the IUD removed? Is the IUD right for me?  Your health care provider will make sure you are a good candidate for an IUD and will discuss side effects with you. This information is not intended to replace advice given to you by your health care provider. Make sure you discuss any questions you have with your health care provider. Document Released: 03/15/2004 Document Revised: 09/17/2015 Document Reviewed: 09/30/2012  2017 Elsevier  Etonogestrel implant What is this medicine? ETONOGESTREL (et oh noe JES trel) is a contraceptive (birth control) device. It is used to prevent pregnancy. It can be used for up to 3 years. COMMON BRAND NAME(S): Implanon, Nexplanon What should I tell my health care provider before I take this medicine? They need to know if you have any of these conditions: -abnormal vaginal bleeding -blood vessel disease or blood clots -cancer of the breast, cervix, or liver -depression -diabetes -gallbladder disease -headaches -heart disease or recent heart attack -high blood pressure -high cholesterol -kidney disease -liver disease -renal disease -seizures -tobacco smoker -an unusual or allergic reaction to etonogestrel, other hormones, anesthetics or antiseptics, medicines, foods, dyes, or preservatives -pregnant or trying to get pregnant -breast-feeding How should I  use this medicine? This device is inserted just under the skin on the inner side of your upper arm by a health care professional. Talk to your pediatrician regarding the use of this medicine in children. Special care may be needed. What if I miss a dose? This does not apply. What may  interact with this medicine? Do not take this medicine with any of the following medications: -amprenavir -bosentan -fosamprenavir This medicine may also interact with the following medications: -barbiturate medicines for inducing sleep or treating seizures -certain medicines for fungal infections like ketoconazole and itraconazole -griseofulvin -medicines to treat seizures like carbamazepine, felbamate, oxcarbazepine, phenytoin, topiramate -modafinil -phenylbutazone -rifampin -some medicines to treat HIV infection like atazanavir, indinavir, lopinavir, nelfinavir, tipranavir, ritonavir -St. John's wort What should I watch for while using this medicine? This product does not protect you against HIV infection (AIDS) or other sexually transmitted diseases. You should be able to feel the implant by pressing your fingertips over the skin where it was inserted. Contact your doctor if you cannot feel the implant, and use a non-hormonal birth control method (such as condoms) until your doctor confirms that the implant is in place. If you feel that the implant may have broken or become bent while in your arm, contact your healthcare provider. What side effects may I notice from receiving this medicine? Side effects that you should report to your doctor or health care professional as soon as possible: -allergic reactions like skin rash, itching or hives, swelling of the face, lips, or tongue -breast lumps -changes in emotions or moods -depressed mood -heavy or prolonged menstrual bleeding -pain, irritation, swelling, or bruising at the insertion site -scar at site of insertion -signs of infection at the insertion site such as fever, and skin redness, pain or discharge -signs of pregnancy -signs and symptoms of a blood clot such as breathing problems; changes in vision; chest pain; severe, sudden headache; pain, swelling, warmth in the leg; trouble speaking; sudden numbness or weakness of the  face, arm or leg -signs and symptoms of liver injury like dark yellow or brown urine; general ill feeling or flu-like symptoms; light-colored stools; loss of appetite; nausea; right upper belly pain; unusually weak or tired; yellowing of the eyes or skin -unusual vaginal bleeding, discharge -signs and symptoms of a stroke like changes in vision; confusion; trouble speaking or understanding; severe headaches; sudden numbness or weakness of the face, arm or leg; trouble walking; dizziness; loss of balance or coordination Side effects that usually do not require medical attention (report to your doctor or health care professional if they continue or are bothersome): -acne -back pain -breast pain -changes in weight -dizziness -general ill feeling or flu-like symptoms -headache -irregular menstrual bleeding -nausea -sore throat -vaginal irritation or inflammation Where should I keep my medicine? This drug is given in a hospital or clinic and will not be stored at home.  2017 Elsevier/Gold Standard (2015-05-14 10:56:20)

## 2016-03-24 LAB — PAIN MGMT, PROFILE 6 CONF W/O MM, U
6 ACETYLMORPHINE: NEGATIVE ng/mL (ref <10)
Alcohol Metabolites: NEGATIVE ng/mL (ref <500)
Amphetamines: NEGATIVE ng/mL (ref <500)
Barbiturates: NEGATIVE ng/mL (ref <300)
Benzodiazepines: NEGATIVE ng/mL (ref <100)
COCAINE METABOLITE: NEGATIVE ng/mL (ref <150)
Creatinine: 243.7 mg/dL (ref >=20.0)
METHADONE METABOLITE: NEGATIVE ng/mL (ref <100)
Marijuana Metabolite: NEGATIVE ng/mL (ref <20)
OXYCODONE: NEGATIVE ng/mL (ref <100)
Opiates: NEGATIVE ng/mL (ref <100)
Oxidant: NEGATIVE ug/mL (ref <200)
PH: 6.97 (ref 4.5–9.0)
PHENCYCLIDINE: NEGATIVE ng/mL (ref <25)
PLEASE NOTE: 0

## 2016-03-24 LAB — PRENATAL PROFILE (SOLSTAS)
ANTIBODY SCREEN: NEGATIVE
BASOS PCT: 0 %
Basophils Absolute: 0 cells/uL (ref 0–200)
EOS ABS: 100 {cells}/uL (ref 15–500)
EOS PCT: 1 %
HCT: 35.3 % (ref 35.0–45.0)
HIV 1&2 Ab, 4th Generation: NONREACTIVE
Hemoglobin: 11.7 g/dL (ref 11.7–15.5)
Hepatitis B Surface Ag: NEGATIVE
LYMPHS PCT: 19 %
Lymphs Abs: 1900 cells/uL (ref 850–3900)
MCH: 24.5 pg — ABNORMAL LOW (ref 27.0–33.0)
MCHC: 33.1 g/dL (ref 32.0–36.0)
MCV: 74 fL — AB (ref 80.0–100.0)
MONOS PCT: 6 %
MPV: 10.3 fL (ref 7.5–12.5)
Monocytes Absolute: 600 cells/uL (ref 200–950)
Neutro Abs: 7400 cells/uL (ref 1500–7800)
Neutrophils Relative %: 74 %
PLATELETS: 239 10*3/uL (ref 140–400)
RBC: 4.77 MIL/uL (ref 3.80–5.10)
RDW: 15.8 % — ABNORMAL HIGH (ref 11.0–15.0)
RH TYPE: POSITIVE
Rubella: 3.49 Index — ABNORMAL HIGH (ref <0.90)
WBC: 10 10*3/uL (ref 3.8–10.8)

## 2016-03-24 LAB — CULTURE, OB URINE

## 2016-03-25 LAB — CYTOLOGY - PAP
CHLAMYDIA, DNA PROBE: NEGATIVE
DIAGNOSIS: NEGATIVE
Neisseria Gonorrhea: NEGATIVE

## 2016-03-28 ENCOUNTER — Encounter: Payer: Self-pay | Admitting: Advanced Practice Midwife

## 2016-03-28 LAB — HEMOGLOBINOPATHY EVALUATION
HCT: 35.3 % (ref 35.0–45.0)
HGB A2 QUANT: 5.6 % — AB (ref 1.8–3.5)
HGB F QUANT: 2.8 % — AB (ref <2.0)
Hemoglobin: 11.7 g/dL (ref 11.7–15.5)
Hgb A: 91.6 % — ABNORMAL LOW (ref >96.0)
MCH: 24.5 pg — AB (ref 27.0–33.0)
MCV: 74 fL — ABNORMAL LOW (ref 80.0–100.0)
RBC: 4.77 MIL/uL (ref 3.80–5.10)
RDW: 15.8 % — AB (ref 11.0–15.0)

## 2016-03-30 ENCOUNTER — Other Ambulatory Visit: Payer: Medicaid Other

## 2016-03-30 DIAGNOSIS — Z3401 Encounter for supervision of normal first pregnancy, first trimester: Secondary | ICD-10-CM

## 2016-03-30 DIAGNOSIS — Z3402 Encounter for supervision of normal first pregnancy, second trimester: Secondary | ICD-10-CM

## 2016-03-31 LAB — 2HR GTT W 1 HR, CARPENTER, 75 G
GLUCOSE, 1 HR, GEST: 88 mg/dL (ref <180)
GLUCOSE, FASTING, GEST: 79 mg/dL (ref 65–91)
Glucose, 2 Hr, Gest: 89 mg/dL (ref <153)

## 2016-04-20 ENCOUNTER — Ambulatory Visit (HOSPITAL_COMMUNITY)
Admission: RE | Admit: 2016-04-20 | Discharge: 2016-04-20 | Disposition: A | Discharge status: Home / Self Care | Payer: Medicaid Other | Source: Ambulatory Visit | Attending: Advanced Practice Midwife | Admitting: Advanced Practice Midwife

## 2016-04-20 ENCOUNTER — Ambulatory Visit (INDEPENDENT_AMBULATORY_CARE_PROVIDER_SITE_OTHER): Payer: Medicaid Other | Admitting: Obstetrics and Gynecology

## 2016-04-20 VITALS — BP 95/55 | HR 89 | Wt 187.0 lb

## 2016-04-20 DIAGNOSIS — Z3492 Encounter for supervision of normal pregnancy, unspecified, second trimester: Secondary | ICD-10-CM

## 2016-04-20 DIAGNOSIS — Z148 Genetic carrier of other disease: Secondary | ICD-10-CM | POA: Insufficient documentation

## 2016-04-20 DIAGNOSIS — Z363 Encounter for antenatal screening for malformations: Secondary | ICD-10-CM | POA: Insufficient documentation

## 2016-04-20 DIAGNOSIS — Z3A18 18 weeks gestation of pregnancy: Secondary | ICD-10-CM | POA: Diagnosis not present

## 2016-04-20 DIAGNOSIS — O09892 Supervision of other high risk pregnancies, second trimester: Secondary | ICD-10-CM

## 2016-04-20 DIAGNOSIS — Z3482 Encounter for supervision of other normal pregnancy, second trimester: Secondary | ICD-10-CM

## 2016-04-20 NOTE — Progress Notes (Signed)
   PRENATAL VISIT NOTE  Subjective:  Casey Quinn is a 25 y.o. [id number] at 6141w0d being seen today for ongoing prenatal care.  She is currently monitored for the following issues for this low-risk pregnancy and has Supervision of normal pregnancy; Maternal atypical antibody; and Short interval between pregnancies affecting pregnancy in second trimester, antepartum on her problem list.  Patient reports no complaints.  Contractions: Not present. Vag. Bleeding: None.  Movement: Present. Denies leaking of fluid.   The following portions of the patient's history were reviewed and updated as appropriate: allergies, current medications, past family history, past medical history, past social history, past surgical history and problem list. Problem list updated.  Objective:   Vitals:   04/20/16 1412  BP: (!) 95/55  Pulse: 89  Weight: 187 lb (84.8 kg)    Fetal Status: Fetal Heart Rate (bpm): 150   Movement: Present     General:  Alert, oriented and cooperative. Patient is in no acute distress.  Skin: Skin is warm and dry. No rash noted.   Cardiovascular: Normal heart rate noted  Respiratory: Normal respiratory effort, no problems with respiration noted  Abdomen: Soft, gravid, appropriate for gestational age. Pain/Pressure: Absent     Pelvic:  Cervical exam deferred        Extremities: Normal range of motion.  Edema: None  Mental Status: Normal mood and affect. Normal behavior. Normal judgment and thought content.   Assessment and Plan:  Pregnancy: G5P4004 at 7841w0d  1. Encounter for supervision of other normal pregnancy in second trimester Patient is doing well without complaints Anatomy ultrasound report reviewed with the patient. Follow up ordered RTC in 8 weeks per Brx schedule for third trimester labs, glucola and tdap  2. Short interval between pregnancies affecting pregnancy in second trimester, antepartum   General obstetric precautions including but not limited to vaginal bleeding,  contractions, leaking of fluid and fetal movement were reviewed in detail with the patient. Please refer to After Visit Summary for other counseling recommendations.  Return in about 8 weeks (around 06/15/2016) for ROB and 2 hr glucola.   Catalina AntiguaPeggy Assyria Morreale, MD

## 2016-04-21 LAB — AFP, QUAD SCREEN
AFP: 35.1 ng/mL
Age Alone: 1:1030 {titer}
Curr Gest Age: 18 weeks
Down Syndrome Scr Risk Est: 1:4620 {titer}
HCG, Total: 28.34 IU/mL
INH: 141.1 pg/mL
INTERPRETATION-AFP: NEGATIVE
MoM for AFP: 0.81
MoM for INH: 0.92
MoM for hCG: 1.15
Open Spina bifida: NEGATIVE
TRI 18 SCR RISK EST: NEGATIVE
uE3 Mom: 1.05
uE3 Value: 1.22 ng/mL

## 2016-04-25 NOTE — L&D Delivery Note (Signed)
26 y.o. [id number] at 4113w0d delivered a viable female infant in cephalic, LOA position. No nuchal cord. Anterior shoulder delivered with ease. 60 sec delayed cord clamping. Cord clamped x2 and cut. Placenta delivered spontaneously intact, with 3VC. Fundus firm on exam with massage and pitocin. Good hemostasis noted. 200 mcg ppx cytotec placed PR given grand multiparity.   Anesthesia: Epidural Laceration: None  Suture: None Good hemostasis noted. EBL: 100 cc  Mom and baby recovering in LDR.    Apgars: APGAR (1 MIN): 9   APGAR (5 MINS): 9       Weight: Pending skin to skin  Sponge and instrument count were correct x2. Placenta sent to L&D  Al CorpusMatthew R Zeitler, MD [facility] 09/21/2016, 12:43 PM   OB FELLOW DELIVERY ATTESTATION  I was gloved and present for the delivery in its entirety, and I agree with the above resident's note.    Jen MowElizabeth Annely Sliva, DO OB Fellow

## 2016-05-05 ENCOUNTER — Telehealth: Payer: Self-pay | Admitting: General Practice

## 2016-05-05 NOTE — Telephone Encounter (Signed)
Patient called and left message stating she is [redacted] weeks pregnant and is having dizziness, vomiting, & tingling in her legs. Called patient, no answer- left message stating we are trying to reach you to return your phone call, please call us back

## 2016-05-18 ENCOUNTER — Ambulatory Visit (HOSPITAL_COMMUNITY): Payer: Medicaid Other | Attending: Obstetrics and Gynecology

## 2016-06-21 ENCOUNTER — Telehealth: Payer: Self-pay | Admitting: Student

## 2016-06-21 ENCOUNTER — Encounter: Payer: Medicaid Other | Admitting: Student

## 2016-07-11 ENCOUNTER — Encounter: Payer: Self-pay | Admitting: General Practice

## 2016-07-11 ENCOUNTER — Encounter: Payer: Medicaid Other | Admitting: Obstetrics & Gynecology

## 2016-07-18 ENCOUNTER — Other Ambulatory Visit (HOSPITAL_COMMUNITY)
Admission: RE | Admit: 2016-07-18 | Discharge: 2016-07-18 | Disposition: A | Discharge status: Home / Self Care | Payer: Medicaid Other | Source: Ambulatory Visit | Attending: Medical | Admitting: Medical

## 2016-07-18 ENCOUNTER — Ambulatory Visit (INDEPENDENT_AMBULATORY_CARE_PROVIDER_SITE_OTHER): Payer: Medicaid Other | Admitting: Medical

## 2016-07-18 VITALS — BP 99/58 | HR 106 | Wt 191.6 lb

## 2016-07-18 DIAGNOSIS — O0993 Supervision of high risk pregnancy, unspecified, third trimester: Secondary | ICD-10-CM | POA: Insufficient documentation

## 2016-07-18 DIAGNOSIS — Z124 Encounter for screening for malignant neoplasm of cervix: Secondary | ICD-10-CM

## 2016-07-18 DIAGNOSIS — O368130 Decreased fetal movements, third trimester, not applicable or unspecified: Secondary | ICD-10-CM

## 2016-07-18 DIAGNOSIS — Z3493 Encounter for supervision of normal pregnancy, unspecified, third trimester: Secondary | ICD-10-CM | POA: Diagnosis present

## 2016-07-18 DIAGNOSIS — Z3A30 30 weeks gestation of pregnancy: Secondary | ICD-10-CM | POA: Insufficient documentation

## 2016-07-18 DIAGNOSIS — N898 Other specified noninflammatory disorders of vagina: Secondary | ICD-10-CM | POA: Diagnosis not present

## 2016-07-18 DIAGNOSIS — Z3483 Encounter for supervision of other normal pregnancy, third trimester: Secondary | ICD-10-CM | POA: Diagnosis not present

## 2016-07-18 DIAGNOSIS — Z113 Encounter for screening for infections with a predominantly sexual mode of transmission: Secondary | ICD-10-CM

## 2016-07-18 DIAGNOSIS — O26893 Other specified pregnancy related conditions, third trimester: Secondary | ICD-10-CM | POA: Insufficient documentation

## 2016-07-18 DIAGNOSIS — Z23 Encounter for immunization: Secondary | ICD-10-CM

## 2016-07-18 NOTE — Progress Notes (Signed)
   PRENATAL VISIT NOTE  Subjective:  Casey Quinn is a 26 y.o. [id number] at 1168w5d being seen today for ongoing prenatal care.  She is currently monitored for the following issues for this low-risk pregnancy and has Supervision of normal pregnancy; Maternal atypical antibody; and Short interval between pregnancies affecting pregnancy in second trimester, antepartum on her problem list.  Patient reports decreased fetal movement since yesterday.  Contractions: Irregular. Vag. Bleeding: None.  Movement: (!) Decreased. Denies leaking of fluid.   The following portions of the patient's history were reviewed and updated as appropriate: allergies, current medications, past family history, past medical history, past social history, past surgical history and problem list. Problem list updated.  Objective:   Vitals:   07/18/16 0828  BP: (!) 99/58  Pulse: (!) 106  Weight: 191 lb 9.6 oz (86.9 kg)    Fetal Status: Fetal Heart Rate (bpm): 128 Fundal Height: 31 cm Movement: (!) Decreased     General:  Alert, oriented and cooperative. Patient is in no acute distress.  Skin: Skin is warm and dry. No rash noted.   Cardiovascular: Normal heart rate noted  Respiratory: Normal respiratory effort, no problems with respiration noted  Abdomen: Soft, gravid, appropriate for gestational age. Pain/Pressure: Present     Pelvic:  Cervical exam deferred        Extremities: Normal range of motion.  Edema: None  Mental Status: Normal mood and affect. Normal behavior. Normal judgment and thought content.   Assessment and Plan:  Pregnancy: G5P4004 at 6468w5d  1. Encounter for supervision of low-risk pregnancy in third trimester - Glucose Tolerance, 2 Hours w/1 Hour - HIV antibody (with reflex) - CBC - RPR  2. Need for Tdap vaccination  3. Vaginal discharge in pregnancy in third trimester - Cervicovaginal ancillary only  4. Decreased fetal movement - NST today, reassuring for GA. Patient able to mark > 10  movements  Preterm labor symptoms and general obstetric precautions including but not limited to vaginal bleeding, contractions, leaking of fluid and fetal movement were reviewed in detail with the patient. Please refer to After Visit Summary for other counseling recommendations.  Return in about 2 weeks (around 08/01/2016) for LOB.   Marny LowensteinJulie N Talley Casco, PA-C

## 2016-07-18 NOTE — Progress Notes (Signed)
28 week labs today Tdap vaccine today C/o vaginal irritation

## 2016-07-18 NOTE — Patient Instructions (Signed)
Braxton Hicks Contractions °Contractions of the uterus can occur throughout pregnancy, but they are not always a sign that you are in labor. You may have practice contractions called Braxton Hicks contractions. These false labor contractions are sometimes confused with true labor. °What are Braxton Hicks contractions? °Braxton Hicks contractions are tightening movements that occur in the muscles of the uterus before labor. Unlike true labor contractions, these contractions do not result in opening (dilation) and thinning of the cervix. Toward the end of pregnancy (32-34 weeks), Braxton Hicks contractions can happen more often and may become stronger. These contractions are sometimes difficult to tell apart from true labor because they can be very uncomfortable. You should not feel embarrassed if you go to the hospital with false labor. °Sometimes, the only way to tell if you are in true labor is for your health care provider to look for changes in the cervix. The health care provider will do a physical exam and may monitor your contractions. If you are not in true labor, the exam should show that your cervix is not dilating and your water has not broken. °If there are no prenatal problems or other health problems associated with your pregnancy, it is completely safe for you to be sent home with false labor. You may continue to have Braxton Hicks contractions until you go into true labor. °How can I tell the difference between true labor and false labor? °· Differences °¨ False labor °¨ Contractions last 30-70 seconds.: Contractions are usually shorter and not as strong as true labor contractions. °¨ Contractions become very regular.: Contractions are usually irregular. °¨ Discomfort is usually felt in the top of the uterus, and it spreads to the lower abdomen and low back.: Contractions are often felt in the front of the lower abdomen and in the groin. °¨ Contractions do not go away with walking.: Contractions may  go away when you walk around or change positions while lying down. °¨ Contractions usually become more intense and increase in frequency.: Contractions get weaker and are shorter-lasting as time goes on. °¨ The cervix dilates and gets thinner.: The cervix usually does not dilate or become thin. °Follow these instructions at home: °¨ Take over-the-counter and prescription medicines only as told by your health care provider. °¨ Keep up with your usual exercises and follow other instructions from your health care provider. °¨ Eat and drink lightly if you think you are going into labor. °¨ If Braxton Hicks contractions are making you uncomfortable: °¨ Change your position from lying down or resting to walking, or change from walking to resting. °¨ Sit and rest in a tub of warm water. °¨ Drink enough fluid to keep your urine clear or pale yellow. Dehydration may cause these contractions. °¨ Do slow and deep breathing several times an hour. °¨ Keep all follow-up prenatal visits as told by your health care provider. This is important. °Contact a health care provider if: °¨ You have a fever. °¨ You have continuous pain in your abdomen. °Get help right away if: °¨ Your contractions become stronger, more regular, and closer together. °¨ You have fluid leaking or gushing from your vagina. °¨ You pass blood-tinged mucus (bloody show). °¨ You have bleeding from your vagina. °¨ You have low back pain that you never had before. °¨ You feel your baby’s head pushing down and causing pelvic pressure. °¨ Your baby is not moving inside you as much as it used to. °Summary °¨ Contractions that occur before labor are   called Braxton Hicks contractions, false labor, or practice contractions. °¨ Braxton Hicks contractions are usually shorter, weaker, farther apart, and less regular than true labor contractions. True labor contractions usually become progressively stronger and regular and they become more frequent. °¨ Manage discomfort from  Braxton Hicks contractions by changing position, resting in a warm bath, drinking plenty of water, or practicing deep breathing. °This information is not intended to replace advice given to you by your health care provider. Make sure you discuss any questions you have with your health care provider. °Document Released: 04/11/2005 Document Revised: 02/29/2016 Document Reviewed: 02/29/2016 °Elsevier Interactive Patient Education © 2017 Elsevier Inc. °Fetal Movement Counts °Patient Name: ________________________________________________ Patient Due Date: ____________________ °What is a fetal movement count? °A fetal movement count is the number of times that you feel your baby move during a certain amount of time. This may also be called a fetal kick count. A fetal movement count is recommended for every pregnant woman. You may be asked to start counting fetal movements as early as week 28 of your pregnancy. °Pay attention to when your baby is most active. You may notice your baby's sleep and wake cycles. You may also notice things that make your baby move more. You should do a fetal movement count: °· When your baby is normally most active. °· At the same time each day. °A good time to count movements is while you are resting, after having something to eat and drink. °How do I count fetal movements? °1. Find a quiet, comfortable area. Sit, or lie down on your side. °2. Write down the date, the start time and stop time, and the number of movements that you felt between those two times. Take this information with you to your health care visits. °3. For 2 hours, count kicks, flutters, swishes, rolls, and jabs. You should feel at least 10 movements during 2 hours. °4. You may stop counting after you have felt 10 movements. °5. If you do not feel 10 movements in 2 hours, have something to eat and drink. Then, keep resting and counting for 1 hour. If you feel at least 4 movements during that hour, you may stop  counting. °Contact a health care provider if: °· You feel fewer than 4 movements in 2 hours. °· Your baby is not moving like he or she usually does. °Date: ____________ Start time: ____________ Stop time: ____________ Movements: ____________ °Date: ____________ Start time: ____________ Stop time: ____________ Movements: ____________ °Date: ____________ Start time: ____________ Stop time: ____________ Movements: ____________ °Date: ____________ Start time: ____________ Stop time: ____________ Movements: ____________ °Date: ____________ Start time: ____________ Stop time: ____________ Movements: ____________ °Date: ____________ Start time: ____________ Stop time: ____________ Movements: ____________ °Date: ____________ Start time: ____________ Stop time: ____________ Movements: ____________ °Date: ____________ Start time: ____________ Stop time: ____________ Movements: ____________ °Date: ____________ Start time: ____________ Stop time: ____________ Movements: ____________ °This information is not intended to replace advice given to you by your health care provider. Make sure you discuss any questions you have with your health care provider. °Document Released: 05/11/2006 Document Revised: 12/09/2015 Document Reviewed: 05/21/2015 °Elsevier Interactive Patient Education © 2017 Elsevier Inc. ° °

## 2016-07-18 NOTE — Progress Notes (Signed)
Pt felt good FM during NST. 

## 2016-07-19 ENCOUNTER — Other Ambulatory Visit: Payer: Self-pay | Admitting: Medical

## 2016-07-19 DIAGNOSIS — N760 Acute vaginitis: Principal | ICD-10-CM

## 2016-07-19 DIAGNOSIS — B3731 Acute candidiasis of vulva and vagina: Secondary | ICD-10-CM

## 2016-07-19 DIAGNOSIS — B373 Candidiasis of vulva and vagina: Secondary | ICD-10-CM

## 2016-07-19 DIAGNOSIS — B9689 Other specified bacterial agents as the cause of diseases classified elsewhere: Secondary | ICD-10-CM

## 2016-07-19 LAB — CERVICOVAGINAL ANCILLARY ONLY
Bacterial vaginitis: POSITIVE — AB
CHLAMYDIA, DNA PROBE: NEGATIVE
Candida vaginitis: POSITIVE — AB
NEISSERIA GONORRHEA: NEGATIVE
Trichomonas: NEGATIVE

## 2016-07-19 LAB — CBC
Hematocrit: 28.7 % — ABNORMAL LOW (ref 34.0–46.6)
Hemoglobin: 9.9 g/dL — ABNORMAL LOW (ref 11.1–15.9)
MCH: 25.3 pg — ABNORMAL LOW (ref 26.6–33.0)
MCHC: 34.5 g/dL (ref 31.5–35.7)
MCV: 73 fL — ABNORMAL LOW (ref 79–97)
PLATELETS: 203 10*3/uL (ref 150–379)
RBC: 3.92 x10E6/uL (ref 3.77–5.28)
RDW: 16.1 % — AB (ref 12.3–15.4)
WBC: 8.3 10*3/uL (ref 3.4–10.8)

## 2016-07-19 LAB — HIV ANTIBODY (ROUTINE TESTING W REFLEX): HIV SCREEN 4TH GENERATION: NONREACTIVE

## 2016-07-19 LAB — GLUCOSE TOLERANCE, 2 HOURS W/ 1HR
GLUCOSE, 1 HOUR: 117 mg/dL (ref 65–179)
GLUCOSE, 2 HOUR: 87 mg/dL (ref 65–152)
GLUCOSE, FASTING: 75 mg/dL (ref 65–91)

## 2016-07-19 LAB — RPR: RPR Ser Ql: NONREACTIVE

## 2016-07-19 MED ORDER — TERCONAZOLE 0.8 % VA CREA: 1.0000 | TOPICAL_CREAM | Freq: Every day | VAGINAL | 0 refills | Status: DC

## 2016-07-19 MED ORDER — METRONIDAZOLE 500 MG PO TABS: 500.0000 mg | ORAL_TABLET | Freq: Two times a day (BID) | ORAL | 0 refills | Status: DC

## 2016-07-20 ENCOUNTER — Other Ambulatory Visit: Payer: Self-pay | Admitting: Obstetrics and Gynecology

## 2016-07-20 ENCOUNTER — Telehealth: Payer: Self-pay

## 2016-07-20 ENCOUNTER — Ambulatory Visit (HOSPITAL_COMMUNITY)
Admission: RE | Admit: 2016-07-20 | Discharge: 2016-07-20 | Disposition: A | Discharge status: Home / Self Care | Payer: Medicaid Other | Source: Ambulatory Visit | Attending: Obstetrics and Gynecology | Admitting: Obstetrics and Gynecology

## 2016-07-20 DIAGNOSIS — Z362 Encounter for other antenatal screening follow-up: Secondary | ICD-10-CM

## 2016-07-20 DIAGNOSIS — Z148 Genetic carrier of other disease: Secondary | ICD-10-CM | POA: Insufficient documentation

## 2016-07-20 DIAGNOSIS — Z3A31 31 weeks gestation of pregnancy: Secondary | ICD-10-CM | POA: Insufficient documentation

## 2016-07-20 DIAGNOSIS — Z3482 Encounter for supervision of other normal pregnancy, second trimester: Secondary | ICD-10-CM

## 2016-07-20 NOTE — Telephone Encounter (Signed)
Pt returned our call regarding results. I informed her of +BV and vaginal yeast. Rx has been sent to her pharmacy - administration instructions given.  Pt voiced understanding.

## 2016-07-20 NOTE — Telephone Encounter (Signed)
Patient has BV and yeast. Rx has been called into [facility]. Called patient no answer left message for her to call the office back regarding test results.

## 2016-08-15 ENCOUNTER — Ambulatory Visit (INDEPENDENT_AMBULATORY_CARE_PROVIDER_SITE_OTHER): Payer: Medicaid Other | Admitting: Family Medicine

## 2016-08-15 ENCOUNTER — Encounter: Payer: Medicaid Other | Admitting: Family Medicine

## 2016-08-15 VITALS — BP 118/73 | HR 84 | Wt 193.9 lb

## 2016-08-15 DIAGNOSIS — O09892 Supervision of other high risk pregnancies, second trimester: Secondary | ICD-10-CM

## 2016-08-15 DIAGNOSIS — Z3403 Encounter for supervision of normal first pregnancy, third trimester: Secondary | ICD-10-CM

## 2016-08-15 NOTE — Progress Notes (Signed)
      Subjective:  Casey Quinn is a 26 y.o. [id number] at [redacted]w[redacted]d being seen today for ongoing prenatal care.  She is currently monitored for the following issues for this low-risk pregnancy and has Supervision of normal pregnancy; Maternal atypical antibody; and Short interval between pregnancies affecting pregnancy in second trimester, antepartum on her problem list.  Patient reports occasional contractions.  Contractions: Irregular. Vag. Bleeding: None.  Movement: Present. Denies leaking of fluid.   The following portions of the patient's history were reviewed and updated as appropriate: allergies, current medications, past family history, past medical history, past social history, past surgical history and problem list. Problem list updated.  Objective:   Vitals:   08/15/16 1628  BP: 118/73  Pulse: 84  Weight: 193 lb 14.4 oz (88 kg)    Fetal Status: Fetal Heart Rate (bpm): 125 Fundal Height: 35 cm Movement: Present      General:  Alert, oriented and cooperative. Patient is in no acute distress.  Skin: Skin is warm and dry. No rash noted.   Cardiovascular: Normal heart rate noted  Respiratory: Normal respiratory effort, no problems with respiration noted  Abdomen: Soft, gravid, appropriate for gestational age. Pain/Pressure: Present     Pelvic:  Cervical exam performed Dilation: Fingertip Effacement (%): 30 Station: Ballotable  Extremities: Normal range of motion.  Edema: Trace  Mental Status: Normal mood and affect. Normal behavior. Normal judgment and thought content.   Urinalysis:      Assessment and Plan:  Pregnancy: G5P4004 at [redacted]w[redacted]d  1. Encounter for supervision of normal first pregnancy in third trimester - UTD on labs - GBS at next visit  2. Short interval between pregnancies affecting pregnancy in second trimester, antepartum   MOC: Unsure MOF: Breast Preterm labor symptoms and general obstetric precautions including but not limited to vaginal bleeding, contractions,  leaking of fluid and fetal movement were reviewed in detail with the patient. Please refer to After Visit Summary for other counseling recommendations.  Return in about 2 weeks (around 08/29/2016) for Routine OB visit.   Jen Mow, DO OB Fellow Center for [facility], [facility]

## 2016-08-15 NOTE — Patient Instructions (Signed)

## 2016-08-30 ENCOUNTER — Other Ambulatory Visit (HOSPITAL_COMMUNITY)
Admission: RE | Admit: 2016-08-30 | Discharge: 2016-08-30 | Disposition: A | Discharge status: Home / Self Care | Payer: Medicaid Other | Source: Ambulatory Visit | Attending: Obstetrics and Gynecology | Admitting: Obstetrics and Gynecology

## 2016-08-30 ENCOUNTER — Ambulatory Visit (INDEPENDENT_AMBULATORY_CARE_PROVIDER_SITE_OTHER): Payer: Medicaid Other | Admitting: Obstetrics and Gynecology

## 2016-08-30 VITALS — BP 116/68 | HR 88 | Wt 194.0 lb

## 2016-08-30 DIAGNOSIS — B9689 Other specified bacterial agents as the cause of diseases classified elsewhere: Secondary | ICD-10-CM | POA: Insufficient documentation

## 2016-08-30 DIAGNOSIS — Z3403 Encounter for supervision of normal first pregnancy, third trimester: Secondary | ICD-10-CM | POA: Diagnosis present

## 2016-08-30 DIAGNOSIS — Z113 Encounter for screening for infections with a predominantly sexual mode of transmission: Secondary | ICD-10-CM

## 2016-08-30 DIAGNOSIS — N760 Acute vaginitis: Secondary | ICD-10-CM | POA: Insufficient documentation

## 2016-08-30 LAB — OB RESULTS CONSOLE GC/CHLAMYDIA: Gonorrhea: NEGATIVE

## 2016-08-30 LAB — OB RESULTS CONSOLE GBS: GBS: POSITIVE

## 2016-08-30 MED ORDER — METRONIDAZOLE 0.75 % VA GEL: 1.0000 | Freq: Every day | VAGINAL | 0 refills | Status: AC

## 2016-08-30 NOTE — Patient Instructions (Signed)
Fetal Movement Counts Patient Name: ________________________________________________ Patient Due Date: ____________________ What is a fetal movement count? A fetal movement count is the number of times that you feel your baby move during a certain amount of time. This may also be called a fetal kick count. A fetal movement count is recommended for every pregnant woman. You may be asked to start counting fetal movements as early as week 28 of your pregnancy. Pay attention to when your baby is most active. You may notice your baby's sleep and wake cycles. You may also notice things that make your baby move more. You should do a fetal movement count:  When your baby is normally most active.  At the same time each day. A good time to count movements is while you are resting, after having something to eat and drink. How do I count fetal movements? 1. Find a quiet, comfortable area. Sit, or lie down on your side. 2. Write down the date, the start time and stop time, and the number of movements that you felt between those two times. Take this information with you to your health care visits. 3. For 2 hours, count kicks, flutters, swishes, rolls, and jabs. You should feel at least 10 movements during 2 hours. 4. You may stop counting after you have felt 10 movements. 5. If you do not feel 10 movements in 2 hours, have something to eat and drink. Then, keep resting and counting for 1 hour. If you feel at least 4 movements during that hour, you may stop counting. Contact a health care provider if:  You feel fewer than 4 movements in 2 hours.  Your baby is not moving like he or she usually does. Date: ____________ Start time: ____________ Stop time: ____________ Movements: ____________ Date: ____________ Start time: ____________ Stop time: ____________ Movements: ____________ Date: ____________ Start time: ____________ Stop time: ____________ Movements: ____________ Date: ____________ Start time:  ____________ Stop time: ____________ Movements: ____________ Date: ____________ Start time: ____________ Stop time: ____________ Movements: ____________ Date: ____________ Start time: ____________ Stop time: ____________ Movements: ____________ Date: ____________ Start time: ____________ Stop time: ____________ Movements: ____________ Date: ____________ Start time: ____________ Stop time: ____________ Movements: ____________ Date: ____________ Start time: ____________ Stop time: ____________ Movements: ____________ This information is not intended to replace advice given to you by your health care provider. Make sure you discuss any questions you have with your health care provider. Document Released: 05/11/2006 Document Revised: 12/09/2015 Document Reviewed: 05/21/2015 Elsevier Interactive Patient Education  2017 [employer] Breastfeeding Deciding to breastfeed is one of the best choices you can make for you and your baby. A change in hormones during pregnancy causes your breast tissue to grow and increases the number and size of your milk ducts. These hormones also allow proteins, sugars, and fats from your blood supply to make breast milk in your milk-producing glands. Hormones prevent breast milk from being released before your baby is born as well as prompt milk flow after birth. Once breastfeeding has begun, thoughts of your baby, as well as his or her sucking or crying, can stimulate the release of milk from your milk-producing glands. Benefits of breastfeeding For Your Baby  Your first milk (colostrum) helps your baby's digestive system function better.  There are antibodies in your milk that help your baby fight off infections.  Your baby has a lower incidence of asthma, allergies, and sudden infant death syndrome.  The nutrients in breast milk are better for your baby than infant formulas and are  designed uniquely for your baby's needs.  Breast milk improves your baby's brain  development.  Your baby is less likely to develop other conditions, such as childhood obesity, asthma, or type 2 diabetes mellitus. For You  Breastfeeding helps to create a very special bond between you and your baby.  Breastfeeding is convenient. Breast milk is always available at the correct temperature and costs nothing.  Breastfeeding helps to burn calories and helps you lose the weight gained during pregnancy.  Breastfeeding makes your uterus contract to its prepregnancy size faster and slows bleeding (lochia) after you give birth.  Breastfeeding helps to lower your risk of developing type 2 diabetes mellitus, osteoporosis, and breast or ovarian cancer later in life. Signs that your baby is hungry Early Signs of Hunger  Increased alertness or activity.  Stretching.  Movement of the head from side to side.  Movement of the head and opening of the mouth when the corner of the mouth or cheek is stroked (rooting).  Increased sucking sounds, smacking lips, cooing, sighing, or squeaking.  Hand-to-mouth movements.  Increased sucking of fingers or hands. Late Signs of Hunger  Fussing.  Intermittent crying. Extreme Signs of Hunger  Signs of extreme hunger will require calming and consoling before your baby will be able to breastfeed successfully. Do not wait for the following signs of extreme hunger to occur before you initiate breastfeeding:  Restlessness.  A loud, strong cry.  Screaming. Breastfeeding basics  Breastfeeding Initiation  Find a comfortable place to sit or lie down, with your neck and back well supported.  Place a pillow or rolled up blanket under your baby to bring him or her to the level of your breast (if you are seated). Nursing pillows are specially designed to help support your arms and your baby while you breastfeed.  Make sure that your baby's abdomen is facing your abdomen.  Gently massage your breast. With your fingertips, massage from your  chest wall toward your nipple in a circular motion. This encourages milk flow. You may need to continue this action during the feeding if your milk flows slowly.  Support your breast with 4 fingers underneath and your thumb above your nipple. Make sure your fingers are well away from your nipple and your baby's mouth.  Stroke your baby's lips gently with your finger or nipple.  When your baby's mouth is open wide enough, quickly bring your baby to your breast, placing your entire nipple and as much of the colored area around your nipple (areola) as possible into your baby's mouth.  More areola should be visible above your baby's upper lip than below the lower lip.  Your baby's tongue should be between his or her lower gum and your breast.  Ensure that your baby's mouth is correctly positioned around your nipple (latched). Your baby's lips should create a seal on your breast and be turned out (everted).  It is common for your baby to suck about 2-3 minutes in order to start the flow of breast milk. Latching  Teaching your baby how to latch on to your breast properly is very important. An improper latch can cause nipple pain and decreased milk supply for you and poor weight gain in your baby. Also, if your baby is not latched onto your nipple properly, he or she may swallow some air during feeding. This can make your baby fussy. Burping your baby when you switch breasts during the feeding can help to get rid of the air. However, teaching  your baby to latch on properly is still the best way to prevent fussiness from swallowing air while breastfeeding. Signs that your baby has successfully latched on to your nipple:  Silent tugging or silent sucking, without causing you pain.  Swallowing heard between every 3-4 sucks.  Muscle movement above and in front of his or her ears while sucking. Signs that your baby has not successfully latched on to nipple:  Sucking sounds or smacking sounds from your  baby while breastfeeding.  Nipple pain. If you think your baby has not latched on correctly, slip your finger into the corner of your baby's mouth to break the suction and place it between your baby's gums. Attempt breastfeeding initiation again. Signs of Successful Breastfeeding  Signs from your baby:  A gradual decrease in the number of sucks or complete cessation of sucking.  Falling asleep.  Relaxation of his or her body.  Retention of a small amount of milk in his or her mouth.  Letting go of your breast by himself or herself. Signs from you:  Breasts that have increased in firmness, weight, and size 1-3 hours after feeding.  Breasts that are softer immediately after breastfeeding.  Increased milk volume, as well as a change in milk consistency and color by the fifth day of breastfeeding.  Nipples that are not sore, cracked, or bleeding. Signs That Your Pecola LeisureBaby is Getting Enough Milk  Wetting at least 1-2 diapers during the first 24 hours after birth.  Wetting at least 5-6 diapers every 24 hours for the first week after birth. The urine should be clear or pale yellow by 5 days after birth.  Wetting 6-8 diapers every 24 hours as your baby continues to grow and develop.  At least 3 stools in a 24-hour period by age 36 days. The stool should be soft and yellow.  At least 3 stools in a 24-hour period by age 3 days. The stool should be seedy and yellow.  No loss of weight greater than 10% of birth weight during the first 13 days of age.  Average weight gain of 4-7 ounces (113-198 g) per week after age 26 days.  Consistent daily weight gain by age 36 days, without weight loss after the age of 2 weeks. After a feeding, your baby may spit up a small amount. This is common. Breastfeeding frequency and duration Frequent feeding will help you make more milk and can prevent sore nipples and breast engorgement. Breastfeed when you feel the need to reduce the fullness of your breasts or  when your baby shows signs of hunger. This is called "breastfeeding on demand." Avoid introducing a pacifier to your baby while you are working to establish breastfeeding (the first 4-6 weeks after your baby is born). After this time you may choose to use a pacifier. Research has shown that pacifier use during the first year of a baby's life decreases the risk of sudden infant death syndrome (SIDS). Allow your baby to feed on each breast as long as he or she wants. Breastfeed until your baby is finished feeding. When your baby unlatches or [place]iznik asleep while feeding from the first breast, offer the second breast. Because newborns are often sleepy in the first few weeks of life, you may need to awaken your baby to get him or her to feed. Breastfeeding times will vary from baby to baby. However, the following rules can serve as a guide to help you ensure that your baby is properly fed:  Newborns (babies  50 weeks of age or younger) may breastfeed every 1-3 hours.  Newborns should not go longer than 3 hours during the day or 5 hours during the night without breastfeeding.  You should breastfeed your baby a minimum of 8 times in a 24-hour period until you begin to introduce solid foods to your baby at around 12 months of age. Breast milk pumping Pumping and storing breast milk allows you to ensure that your baby is exclusively fed your breast milk, even at times when you are unable to breastfeed. This is especially important if you are going back to work while you are still breastfeeding or when you are not able to be present during feedings. Your lactation consultant can give you guidelines on how long it is safe to store breast milk. A breast pump is a machine that allows you to pump milk from your breast into a sterile bottle. The pumped breast milk can then be stored in a refrigerator or freezer. Some breast pumps are operated by hand, while others use electricity. Ask your lactation consultant which type  will work best for you. Breast pumps can be purchased, but some hospitals and breastfeeding support groups lease breast pumps on a monthly basis. A lactation consultant can teach you how to hand express breast milk, if you prefer not to use a pump. Caring for your breasts while you breastfeed Nipples can become dry, cracked, and sore while breastfeeding. The following recommendations can help keep your breasts moisturized and healthy:  Avoid using soap on your nipples.  Wear a supportive bra. Although not required, special nursing bras and tank tops are designed to allow access to your breasts for breastfeeding without taking off your entire bra or top. Avoid wearing underwire-style bras or extremely tight bras.  Air dry your nipples for 3-64minutes after each feeding.  Use only cotton bra pads to absorb leaked breast milk. Leaking of breast milk between feedings is normal.  Use lanolin on your nipples after breastfeeding. Lanolin helps to maintain your skin's normal moisture barrier. If you use pure lanolin, you do not need to wash it off before feeding your baby again. Pure lanolin is not toxic to your baby. You may also hand express a few drops of breast milk and gently massage that milk into your nipples and allow the milk to air dry. In the first few weeks after giving birth, some women experience extremely full breasts (engorgement). Engorgement can make your breasts feel heavy, warm, and tender to the touch. Engorgement peaks within 3-5 days after you give birth. The following recommendations can help ease engorgement:  Completely empty your breasts while breastfeeding or pumping. You may want to start by applying warm, moist heat (in the shower or with warm water-soaked hand towels) just before feeding or pumping. This increases circulation and helps the milk flow. If your baby does not completely empty your breasts while breastfeeding, pump any extra milk after he or she is finished.  Wear  a snug bra (nursing or regular) or tank top for 1-2 days to signal your body to slightly decrease milk production.  Apply ice packs to your breasts, unless this is too uncomfortable for you.  Make sure that your baby is latched on and positioned properly while breastfeeding. If engorgement persists after 48 hours of following these recommendations, contact your health care provider or a Advertising copywriter. Overall health care recommendations while breastfeeding  Eat healthy foods. Alternate between meals and snacks, eating 3 of each per day.  Because what you eat affects your breast milk, some of the foods may make your baby more irritable than usual. Avoid eating these foods if you are sure that they are negatively affecting your baby.  Drink milk, fruit juice, and water to satisfy your thirst (about 10 glasses a day).  Rest often, relax, and continue to take your prenatal vitamins to prevent fatigue, stress, and anemia.  Continue breast self-awareness checks.  Avoid chewing and smoking tobacco. Chemicals from cigarettes that pass into breast milk and exposure to secondhand smoke may harm your baby.  Avoid alcohol and drug use, including marijuana. Some medicines that may be harmful to your baby can pass through breast milk. It is important to ask your health care provider before taking any medicine, including all over-the-counter and prescription medicine as well as vitamin and herbal supplements. It is possible to become pregnant while breastfeeding. If birth control is desired, ask your health care provider about options that will be safe for your baby. Contact a health care provider if:  You feel like you want to stop breastfeeding or have become frustrated with breastfeeding.  You have painful breasts or nipples.  Your nipples are cracked or bleeding.  Your breasts are red, tender, or warm.  You have a swollen area on either breast.  You have a fever or chills.  You have  nausea or vomiting.  You have drainage other than breast milk from your nipples.  Your breasts do not become full before feedings by the fifth day after you give birth.  You feel sad and depressed.  Your baby is too sleepy to eat well.  Your baby is having trouble sleeping.  Your baby is wetting less than 3 diapers in a 24-hour period.  Your baby has less than 3 stools in a 24-hour period.  Your baby's skin or the white part of his or her eyes becomes yellow.  Your baby is not gaining weight by 75 days of age. Get help right away if:  Your baby is overly tired (lethargic) and does not want to wake up and feed.  Your baby develops an unexplained fever. This information is not intended to replace advice given to you by your health care provider. Make sure you discuss any questions you have with your health care provider. Document Released: 04/11/2005 Document Revised: 09/23/2015 Document Reviewed: 10/03/2012 Elsevier Interactive Patient Education  2017 [employer] Inc[employer] of the uterus can occur throughout pregnancy, but they are not always a sign that you are in labor. You may have practice contractions called Braxton Hicks contractions. These false labor contractions are sometimes confused with true labor. What are Deberah Pelton contractions? Braxton Hicks contractions are tightening movements that occur in the muscles of the uterus before labor. Unlike true labor contractions, these contractions do not result in opening (dilation) and thinning of the cervix. Toward the end of pregnancy (32-34 weeks), Braxton Hicks contractions can happen more often and may become stronger. These contractions are sometimes difficult to tell apart from true labor because they can be very uncomfortable. You should not feel embarrassed if you go to the hospital with false labor. Sometimes, the only way to tell if you are in true labor is for your health care provider to  look for changes in the cervix. The health care provider will do a physical exam and may monitor your contractions. If you are not in true labor, the exam should show that your cervix is not dilating and  your water has not broken. If there are no prenatal problems or other health problems associated with your pregnancy, it is completely safe for you to be sent home with false labor. You may continue to have Braxton Hicks contractions until you go into true labor. How can I tell the difference between true labor and false labor?  Differences  False labor  Contractions last 30-70 seconds.: Contractions are usually shorter and not as strong as true labor contractions.  Contractions become very regular.: Contractions are usually irregular.  Discomfort is usually felt in the top of the uterus, and it spreads to the lower abdomen and low back.: Contractions are often felt in the front of the lower abdomen and in the groin.  Contractions do not go away with walking.: Contractions may go away when you walk around or change positions while lying down.  Contractions usually become more intense and increase in frequency.: Contractions get weaker and are shorter-lasting as time goes on.  The cervix dilates and gets thinner.: The cervix usually does not dilate or become thin. Follow these instructions at home:  Take over-the-counter and prescription medicines only as told by your health care provider.  Keep up with your usual exercises and follow other instructions from your health care provider.  Eat and drink lightly if you think you are going into labor.  If Braxton Hicks contractions are making you uncomfortable:  Change your position from lying down or resting to walking, or change from walking to resting.  Sit and rest in a tub of warm water.  Drink enough fluid to keep your urine clear or pale yellow. Dehydration may cause these contractions.  Do slow and deep breathing several times an  hour.  Keep all follow-up prenatal visits as told by your health care provider. This is important. Contact a health care provider if:  You have a fever.  You have continuous pain in your abdomen. Get help right away if:  Your contractions become stronger, more regular, and closer together.  You have fluid leaking or gushing from your vagina.  You pass blood-tinged mucus (bloody show).  You have bleeding from your vagina.  You have low back pain that you never had before.  You feel your baby's head pushing down and causing pelvic pressure.  Your baby is not moving inside you as much as it used to. Summary  Contractions that occur before labor are called Braxton Hicks contractions, false labor, or practice contractions.  Braxton Hicks contractions are usually shorter, weaker, farther apart, and less regular than true labor contractions. True labor contractions usually become progressively stronger and regular and they become more frequent.  Manage discomfort from [facility] contractions by changing position, resting in a warm bath, drinking plenty of water, or practicing deep breathing. This information is not intended to replace advice given to you by your health care provider. Make sure you discuss any questions you have with your health care provider. Document Released: 04/11/2005 Document Revised: 02/29/2016 Document Reviewed: 02/29/2016 Elsevier Interactive Patient Education  2017 [employer].

## 2016-08-30 NOTE — Addendum Note (Signed)
Addended by: Sherre LainASH, Arther Heisler A on: 08/30/2016 10:07 AM   Modules accepted: Orders

## 2016-08-30 NOTE — Progress Notes (Signed)
   PRENATAL VISIT NOTE  Subjective:  Casey Quinn is a 26 y.o. [id number] at 102w6d being seen today for ongoing prenatal care.  She is currently monitored for the following issues for this low-risk pregnancy and has Supervision of normal pregnancy; Maternal atypical antibody; and Short interval between pregnancies affecting pregnancy in second trimester, antepartum on her problem list.  Patient reports occasional contractions.  Contractions: Irregular. Vag. Bleeding: None.  Movement: Present. Denies leaking of fluid.   The following portions of the patient's history were reviewed and updated as appropriate: allergies, current medications, past family history, past medical history, past social history, past surgical history and problem list. Problem list updated.  Objective:   Vitals:   08/30/16 0931  BP: 116/68  Pulse: 88  Weight: 194 lb (88 kg)    Fetal Status: Fetal Heart Rate (bpm): 136 Fundal Height: 36 cm Movement: Present  Presentation: Vertex  General:  Alert, oriented and cooperative. Patient is in no acute distress.  Skin: Skin is warm and dry. No rash noted.   Cardiovascular: Normal heart rate noted  Respiratory: Normal respiratory effort, no problems with respiration noted  Abdomen: Soft, gravid, appropriate for gestational age. Pain/Pressure: Present     Pelvic:  Cervical exam performed Dilation: Fingertip Effacement (%): 30 Station: Ballotable / GC/CT/ Wet Prep/GBS done  Extremities: Normal range of motion.  Edema: Trace  Mental Status: Normal mood and affect. Normal behavior. Normal judgment and thought content.   Assessment and Plan:  Pregnancy: G5P4004 at 222w6d  1. Encounter for supervision of normal first pregnancy in third trimester  - Cervicovaginal ancillary only - Culture, beta strep (group b only)  Preterm labor symptoms and general obstetric precautions including but not limited to vaginal bleeding, contractions, leaking of fluid and fetal movement were  reviewed in detail with the patient. Please refer to After Visit Summary for other counseling recommendations.  Return in about 2 weeks (around 09/13/2016) for Return OB visit.   Raelyn Moraawson, Ravin Denardo, CNM

## 2016-08-31 LAB — CERVICOVAGINAL ANCILLARY ONLY
Bacterial vaginitis: POSITIVE — AB
Candida vaginitis: NEGATIVE
Chlamydia: NEGATIVE
NEISSERIA GONORRHEA: NEGATIVE
Trichomonas: NEGATIVE

## 2016-09-02 LAB — CULTURE, BETA STREP (GROUP B ONLY): STREP GP B CULTURE: POSITIVE — AB

## 2016-09-05 ENCOUNTER — Ambulatory Visit (INDEPENDENT_AMBULATORY_CARE_PROVIDER_SITE_OTHER): Payer: Medicaid Other | Admitting: Medical

## 2016-09-05 ENCOUNTER — Encounter: Payer: Self-pay | Admitting: Medical

## 2016-09-05 DIAGNOSIS — Z3403 Encounter for supervision of normal first pregnancy, third trimester: Secondary | ICD-10-CM

## 2016-09-05 NOTE — Progress Notes (Signed)
   PRENATAL VISIT NOTE  Subjective:  Casey Quinn is a 26 y.o. [id number] at 7559w5d being seen today for ongoing prenatal care.  She is currently monitored for the following issues for this low-risk pregnancy and has Supervision of normal pregnancy; Maternal atypical antibody; and Short interval between pregnancies affecting pregnancy in second trimester, antepartum on her problem list.  Patient reports no complaints.  Contractions: Irregular. Vag. Bleeding: None.  Movement: Present. Denies leaking of fluid.   The following portions of the patient's history were reviewed and updated as appropriate: allergies, current medications, past family history, past medical history, past social history, past surgical history and problem list. Problem list updated.  Objective:   Vitals:   09/05/16 1305  BP: 126/67  Pulse: 85  Weight: 189 lb 6.4 oz (85.9 kg)    Fetal Status: Fetal Heart Rate (bpm): 136 Fundal Height: 37 cm Movement: Present  Presentation: Vertex  General:  Alert, oriented and cooperative. Patient is in no acute distress.  Skin: Skin is warm and dry. No rash noted.   Cardiovascular: Normal heart rate noted  Respiratory: Normal respiratory effort, no problems with respiration noted  Abdomen: Soft, gravid, appropriate for gestational age. Pain/Pressure: Present     Pelvic:  Cervical exam performed Dilation: Fingertip Effacement (%): 30 Station: Ballotable  Extremities: Normal range of motion.  Edema: None  Mental Status: Normal mood and affect. Normal behavior. Normal judgment and thought content.   Assessment and Plan:  Pregnancy: G5P4004 at 4759w5d  1. Encounter for supervision of normal first pregnancy in third trimester - Doing well - Informed of +GBS and need for treatment in labor  Term labor symptoms and general obstetric precautions including but not limited to vaginal bleeding, contractions, leaking of fluid and fetal movement were reviewed in detail with the patient. Please  refer to After Visit Summary for other counseling recommendations.  Return in about 1 week (around 09/12/2016) for LOB.   Marny LowensteinWenzel, Julie N, PA-C

## 2016-09-05 NOTE — Patient Instructions (Signed)
Fetal Movement Counts Patient Name: ________________________________________________ Patient Due Date: ____________________ What is a fetal movement count? A fetal movement count is the number of times that you feel your baby move during a certain amount of time. This may also be called a fetal kick count. A fetal movement count is recommended for every pregnant woman. You may be asked to start counting fetal movements as early as week 28 of your pregnancy. Pay attention to when your baby is most active. You may notice your baby's sleep and wake cycles. You may also notice things that make your baby move more. You should do a fetal movement count:  When your baby is normally most active.  At the same time each day. A good time to count movements is while you are resting, after having something to eat and drink. How do I count fetal movements? 1. Find a quiet, comfortable area. Sit, or lie down on your side. 2. Write down the date, the start time and stop time, and the number of movements that you felt between those two times. Take this information with you to your health care visits. 3. For 2 hours, count kicks, flutters, swishes, rolls, and jabs. You should feel at least 10 movements during 2 hours. 4. You may stop counting after you have felt 10 movements. 5. If you do not feel 10 movements in 2 hours, have something to eat and drink. Then, keep resting and counting for 1 hour. If you feel at least 4 movements during that hour, you may stop counting. Contact a health care provider if:  You feel fewer than 4 movements in 2 hours.  Your baby is not moving like he or she usually does. Date: ____________ Start time: ____________ Stop time: ____________ Movements: ____________ Date: ____________ Start time: ____________ Stop time: ____________ Movements: ____________ Date: ____________ Start time: ____________ Stop time: ____________ Movements: ____________ Date: ____________ Start time:  ____________ Stop time: ____________ Movements: ____________ Date: ____________ Start time: ____________ Stop time: ____________ Movements: ____________ Date: ____________ Start time: ____________ Stop time: ____________ Movements: ____________ Date: ____________ Start time: ____________ Stop time: ____________ Movements: ____________ Date: ____________ Start time: ____________ Stop time: ____________ Movements: ____________ Date: ____________ Start time: ____________ Stop time: ____________ Movements: ____________ This information is not intended to replace advice given to you by your health care provider. Make sure you discuss any questions you have with your health care provider. Document Released: 05/11/2006 Document Revised: 12/09/2015 Document Reviewed: 05/21/2015 Elsevier Interactive Patient Education  2017 [employer] Braxton Hicks Contractions Contractions of the uterus can occur throughout pregnancy, but they are not always a sign that you are in labor. You may have practice contractions called Braxton Hicks contractions. These false labor contractions are sometimes confused with true labor. What are Braxton Hicks contractions? Braxton Hicks contractions are tightening movements that occur in the muscles of the uterus before labor. Unlike true labor contractions, these contractions do not result in opening (dilation) and thinning of the cervix. Toward the end of pregnancy (32-34 weeks), Braxton Hicks contractions can happen more often and may become stronger. These contractions are sometimes difficult to tell apart from true labor because they can be very uncomfortable. You should not feel embarrassed if you go to the hospital with false labor. Sometimes, the only way to tell if you are in true labor is for your health care provider to look for changes in the cervix. The health care provider will do a physical exam and may monitor your contractions. If you   are not in true labor, the exam  should show that your cervix is not dilating and your water has not broken. If there are no prenatal problems or other health problems associated with your pregnancy, it is completely safe for you to be sent home with false labor. You may continue to have Braxton Hicks contractions until you go into true labor. How can I tell the difference between true labor and false labor?  Differences  False labor  Contractions last 30-70 seconds.: Contractions are usually shorter and not as strong as true labor contractions.  Contractions become very regular.: Contractions are usually irregular.  Discomfort is usually felt in the top of the uterus, and it spreads to the lower abdomen and low back.: Contractions are often felt in the front of the lower abdomen and in the groin.  Contractions do not go away with walking.: Contractions may go away when you walk around or change positions while lying down.  Contractions usually become more intense and increase in frequency.: Contractions get weaker and are shorter-lasting as time goes on.  The cervix dilates and gets thinner.: The cervix usually does not dilate or become thin. Follow these instructions at home:  Take over-the-counter and prescription medicines only as told by your health care provider.  Keep up with your usual exercises and follow other instructions from your health care provider.  Eat and drink lightly if you think you are going into labor.  If Braxton Hicks contractions are making you uncomfortable:  Change your position from lying down or resting to walking, or change from walking to resting.  Sit and rest in a tub of warm water.  Drink enough fluid to keep your urine clear or pale yellow. Dehydration may cause these contractions.  Do slow and deep breathing several times an hour.  Keep all follow-up prenatal visits as told by your health care provider. This is important. Contact a health care provider if:  You have a  fever.  You have continuous pain in your abdomen. Get help right away if:  Your contractions become stronger, more regular, and closer together.  You have fluid leaking or gushing from your vagina.  You pass blood-tinged mucus (bloody show).  You have bleeding from your vagina.  You have low back pain that you never had before.  You feel your baby's head pushing down and causing pelvic pressure.  Your baby is not moving inside you as much as it used to. Summary  Contractions that occur before labor are called Braxton Hicks contractions, false labor, or practice contractions.  Braxton Hicks contractions are usually shorter, weaker, farther apart, and less regular than true labor contractions. True labor contractions usually become progressively stronger and regular and they become more frequent.  Manage discomfort from Braxton Hicks contractions by changing position, resting in a warm bath, drinking plenty of water, or practicing deep breathing. This information is not intended to replace advice given to you by your health care provider. Make sure you discuss any questions you have with your health care provider. Document Released: 04/11/2005 Document Revised: 02/29/2016 Document Reviewed: 02/29/2016 Elsevier Interactive Patient Education  2017 Elsevier Inc[employer]  

## 2016-09-09 ENCOUNTER — Inpatient Hospital Stay (HOSPITAL_COMMUNITY)
Admission: AD | Admit: 2016-09-09 | Discharge: 2016-09-09 | Disposition: A | Discharge status: Home / Self Care | Payer: Medicaid Other | Source: Ambulatory Visit | Attending: Obstetrics & Gynecology | Admitting: Obstetrics & Gynecology

## 2016-09-09 ENCOUNTER — Encounter (HOSPITAL_COMMUNITY): Payer: Self-pay

## 2016-09-09 DIAGNOSIS — O26893 Other specified pregnancy related conditions, third trimester: Secondary | ICD-10-CM | POA: Diagnosis not present

## 2016-09-09 DIAGNOSIS — Z3689 Encounter for other specified antenatal screening: Secondary | ICD-10-CM

## 2016-09-09 DIAGNOSIS — O9989 Other specified diseases and conditions complicating pregnancy, childbirth and the puerperium: Secondary | ICD-10-CM

## 2016-09-09 DIAGNOSIS — Z3A38 38 weeks gestation of pregnancy: Secondary | ICD-10-CM | POA: Insufficient documentation

## 2016-09-09 DIAGNOSIS — N898 Other specified noninflammatory disorders of vagina: Secondary | ICD-10-CM

## 2016-09-09 LAB — POCT FERN TEST: POCT FERN TEST: NEGATIVE

## 2016-09-09 NOTE — MAU Note (Signed)
Thinks her water broke.  About 30 min ago, Was sitting there, had a gush. No fluid since.  Clear fluid. No bleeding. No pain.

## 2016-09-09 NOTE — Discharge Instructions (Signed)
Fetal Movement Counts  Patient Name: ________________________________________________ Patient Due Date: ____________________  What is a fetal movement count?  A fetal movement count is the number of times that you feel your baby move during a certain amount of time. This may also be called a fetal kick count. A fetal movement count is recommended for every pregnant woman. You may be asked to start counting fetal movements as early as week 28 of your pregnancy.  Pay attention to when your baby is most active. You may notice your baby's sleep and wake cycles. You may also notice things that make your baby move more. You should do a fetal movement count:  · When your baby is normally most active.  · At the same time each day.    A good time to count movements is while you are resting, after having something to eat and drink.  How do I count fetal movements?  1. Find a quiet, comfortable area. Sit, or lie down on your side.  2. Write down the date, the start time and stop time, and the number of movements that you felt between those two times. Take this information with you to your health care visits.  3. For 2 hours, count kicks, flutters, swishes, rolls, and jabs. You should feel at least 10 movements during 2 hours.  4. You may stop counting after you have felt 10 movements.  5. If you do not feel 10 movements in 2 hours, have something to eat and drink. Then, keep resting and counting for 1 hour. If you feel at least 4 movements during that hour, you may stop counting.  Contact a health care provider if:  · You feel fewer than 4 movements in 2 hours.  · Your baby is not moving like he or she usually does.  Date: ____________ Start time: ____________ Stop time: ____________ Movements: ____________  Date: ____________ Start time: ____________ Stop time: ____________ Movements: ____________  Date: ____________ Start time: ____________ Stop time: ____________ Movements: ____________  Date: ____________ Start time:  ____________ Stop time: ____________ Movements: ____________  Date: ____________ Start time: ____________ Stop time: ____________ Movements: ____________  Date: ____________ Start time: ____________ Stop time: ____________ Movements: ____________  Date: ____________ Start time: ____________ Stop time: ____________ Movements: ____________  Date: ____________ Start time: ____________ Stop time: ____________ Movements: ____________  Date: ____________ Start time: ____________ Stop time: ____________ Movements: ____________  This information is not intended to replace advice given to you by your health care provider. Make sure you discuss any questions you have with your health care provider.  Document Released: 05/11/2006 Document Revised: 12/09/2015 Document Reviewed: 05/21/2015  Elsevier Interactive Patient Education © 2017 Elsevier Inc[employer]

## 2016-09-09 NOTE — MAU Provider Note (Signed)
S: Casey Quinn is a 26 y.o. [id number] at 3727w2d  who presents to MAU today complaining of leaking of fluid since 30 minutes prior to arrival. She denies vaginal bleeding. She endorses irregular contractions. She reports normal fetal movement.    O: BP 113/72 (BP Location: Right Arm)   Pulse 94   Temp 98.4 F (36.9 C) (Oral)   Resp 16   Wt 190 lb 12 oz (86.5 kg)   LMP 12/16/2015   SpO2 99%   BMI 33.79 kg/m  GENERAL: Well-developed, well-nourished female in no acute distress.  HEAD: Normocephalic, atraumatic.  CHEST: Normal effort of breathing, regular heart rate ABDOMEN: Soft, nontender, gravid PELVIC: Normal external female genitalia. Vagina is pink and rugated. Cervix with normal contour, no lesions. Normal discharge.  No pooling.   Cervical exam:    Dilation: Fingertip Effacement (%): Thick Cervical Position: Posterior Exam by:: Judeth HornErin Myrtis Maille NP  Fetal Monitoring: Baseline: 130 Variability: moderate Accelerations: 15x15 Decelerations: none Contractions: irregular  Results for orders placed or performed during the hospital encounter of 09/09/16 (from the past 24 hour(s))  POCT fern test     Status: Normal   Collection Time: 09/09/16  5:55 PM  Result Value Ref Range   POCT Fern Test    POCT fern test     Status: None   Collection Time: 09/09/16  5:56 PM  Result Value Ref Range   POCT Fern Test Negative = intact amniotic membranes    A: SIUP at 3127w2d  Membranes intact 1. Vaginal discharge during pregnancy in third trimester   2. NST (non-stress test) reactive on fetal surveillance     P: Discharge home Discussed reasons to return to MAU Keep f/u with OB  Judeth HornLawrence, Miniya Miguez, NP 09/09/2016 5:16 PM

## 2016-09-12 ENCOUNTER — Encounter: Payer: Medicaid Other | Admitting: Medical

## 2016-09-13 ENCOUNTER — Ambulatory Visit (INDEPENDENT_AMBULATORY_CARE_PROVIDER_SITE_OTHER): Payer: Medicaid Other | Admitting: Medical

## 2016-09-13 VITALS — BP 99/72 | HR 88 | Wt 188.6 lb

## 2016-09-13 DIAGNOSIS — Z3493 Encounter for supervision of normal pregnancy, unspecified, third trimester: Secondary | ICD-10-CM

## 2016-09-13 NOTE — Progress Notes (Signed)
   PRENATAL VISIT NOTE  Subjective:  Casey Quinn is a 26 y.o. [id number] at 3735w6d being seen today for ongoing prenatal care.  She is currently monitored for the following issues for this low-risk pregnancy and has Supervision of normal pregnancy; Maternal atypical antibody; and Short interval between pregnancies affecting pregnancy in second trimester, antepartum on her problem list.  Patient reports no complaints.  Contractions: Irregular. Vag. Bleeding: None.  Movement: Present. Denies leaking of fluid.   The following portions of the patient's history were reviewed and updated as appropriate: allergies, current medications, past family history, past medical history, past social history, past surgical history and problem list. Problem list updated.  Objective:   Vitals:   09/13/16 0818  BP: 99/72  Pulse: 88  Weight: 188 lb 9.6 oz (85.5 kg)    Fetal Status: Fetal Heart Rate (bpm): 135 Fundal Height: 38 cm Movement: Present     General:  Alert, oriented and cooperative. Patient is in no acute distress.  Skin: Skin is warm and dry. No rash noted.   Cardiovascular: Normal heart rate noted  Respiratory: Normal respiratory effort, no problems with respiration noted  Abdomen: Soft, gravid, appropriate for gestational age. Pain/Pressure: Present     Pelvic:  Cervical exam deferred        Extremities: Normal range of motion.  Edema: None  Mental Status: Normal mood and affect. Normal behavior. Normal judgment and thought content.   Assessment and Plan:  Pregnancy: G5P4004 at 8135w6d  1. Encounter for supervision of low-risk pregnancy in third trimester - Doing well, irregular contractions only - Patient aware of +GBS - Discussed scheduling induction at 41 weeks at next visit if not delivered  Term labor symptoms and general obstetric precautions including but not limited to vaginal bleeding, contractions, leaking of fluid and fetal movement were reviewed in detail with the patient. Please  refer to After Visit Summary for other counseling recommendations.  Return in about 1 week (around 09/20/2016) for LOB.   Vonzella NippleJulie Charidy Cappelletti, PA-C

## 2016-09-13 NOTE — Patient Instructions (Signed)
Fetal Movement Counts Patient Name: ________________________________________________ Patient Due Date: ____________________ What is a fetal movement count? A fetal movement count is the number of times that you feel your baby move during a certain amount of time. This may also be called a fetal kick count. A fetal movement count is recommended for every pregnant woman. You may be asked to start counting fetal movements as early as week 28 of your pregnancy. Pay attention to when your baby is most active. You may notice your baby's sleep and wake cycles. You may also notice things that make your baby move more. You should do a fetal movement count:  When your baby is normally most active.  At the same time each day. A good time to count movements is while you are resting, after having something to eat and drink. How do I count fetal movements? 1. Find a quiet, comfortable area. Sit, or lie down on your side. 2. Write down the date, the start time and stop time, and the number of movements that you felt between those two times. Take this information with you to your health care visits. 3. For 2 hours, count kicks, flutters, swishes, rolls, and jabs. You should feel at least 10 movements during 2 hours. 4. You may stop counting after you have felt 10 movements. 5. If you do not feel 10 movements in 2 hours, have something to eat and drink. Then, keep resting and counting for 1 hour. If you feel at least 4 movements during that hour, you may stop counting. Contact a health care provider if:  You feel fewer than 4 movements in 2 hours.  Your baby is not moving like he or she usually does. Date: ____________ Start time: ____________ Stop time: ____________ Movements: ____________ Date: ____________ Start time: ____________ Stop time: ____________ Movements: ____________ Date: ____________ Start time: ____________ Stop time: ____________ Movements: ____________ Date: ____________ Start time:  ____________ Stop time: ____________ Movements: ____________ Date: ____________ Start time: ____________ Stop time: ____________ Movements: ____________ Date: ____________ Start time: ____________ Stop time: ____________ Movements: ____________ Date: ____________ Start time: ____________ Stop time: ____________ Movements: ____________ Date: ____________ Start time: ____________ Stop time: ____________ Movements: ____________ Date: ____________ Start time: ____________ Stop time: ____________ Movements: ____________ This information is not intended to replace advice given to you by your health care provider. Make sure you discuss any questions you have with your health care provider. Document Released: 05/11/2006 Document Revised: 12/09/2015 Document Reviewed: 05/21/2015 Elsevier Interactive Patient Education  2017 [employer] Braxton Hicks Contractions Contractions of the uterus can occur throughout pregnancy, but they are not always a sign that you are in labor. You may have practice contractions called Braxton Hicks contractions. These false labor contractions are sometimes confused with true labor. What are Braxton Hicks contractions? Braxton Hicks contractions are tightening movements that occur in the muscles of the uterus before labor. Unlike true labor contractions, these contractions do not result in opening (dilation) and thinning of the cervix. Toward the end of pregnancy (32-34 weeks), Braxton Hicks contractions can happen more often and may become stronger. These contractions are sometimes difficult to tell apart from true labor because they can be very uncomfortable. You should not feel embarrassed if you go to the hospital with false labor. Sometimes, the only way to tell if you are in true labor is for your health care provider to look for changes in the cervix. The health care provider will do a physical exam and may monitor your contractions. If you   are not in true labor, the exam  should show that your cervix is not dilating and your water has not broken. If there are no prenatal problems or other health problems associated with your pregnancy, it is completely safe for you to be sent home with false labor. You may continue to have Braxton Hicks contractions until you go into true labor. How can I tell the difference between true labor and false labor?  Differences  False labor  Contractions last 30-70 seconds.: Contractions are usually shorter and not as strong as true labor contractions.  Contractions become very regular.: Contractions are usually irregular.  Discomfort is usually felt in the top of the uterus, and it spreads to the lower abdomen and low back.: Contractions are often felt in the front of the lower abdomen and in the groin.  Contractions do not go away with walking.: Contractions may go away when you walk around or change positions while lying down.  Contractions usually become more intense and increase in frequency.: Contractions get weaker and are shorter-lasting as time goes on.  The cervix dilates and gets thinner.: The cervix usually does not dilate or become thin. Follow these instructions at home:  Take over-the-counter and prescription medicines only as told by your health care provider.  Keep up with your usual exercises and follow other instructions from your health care provider.  Eat and drink lightly if you think you are going into labor.  If Braxton Hicks contractions are making you uncomfortable:  Change your position from lying down or resting to walking, or change from walking to resting.  Sit and rest in a tub of warm water.  Drink enough fluid to keep your urine clear or pale yellow. Dehydration may cause these contractions.  Do slow and deep breathing several times an hour.  Keep all follow-up prenatal visits as told by your health care provider. This is important. Contact a health care provider if:  You have a  fever.  You have continuous pain in your abdomen. Get help right away if:  Your contractions become stronger, more regular, and closer together.  You have fluid leaking or gushing from your vagina.  You pass blood-tinged mucus (bloody show).  You have bleeding from your vagina.  You have low back pain that you never had before.  You feel your baby's head pushing down and causing pelvic pressure.  Your baby is not moving inside you as much as it used to. Summary  Contractions that occur before labor are called Braxton Hicks contractions, false labor, or practice contractions.  Braxton Hicks contractions are usually shorter, weaker, farther apart, and less regular than true labor contractions. True labor contractions usually become progressively stronger and regular and they become more frequent.  Manage discomfort from Braxton Hicks contractions by changing position, resting in a warm bath, drinking plenty of water, or practicing deep breathing. This information is not intended to replace advice given to you by your health care provider. Make sure you discuss any questions you have with your health care provider. Document Released: 04/11/2005 Document Revised: 02/29/2016 Document Reviewed: 02/29/2016 Elsevier Interactive Patient Education  2017 Elsevier Inc[employer]  

## 2016-09-14 ENCOUNTER — Encounter (HOSPITAL_COMMUNITY): Payer: Self-pay

## 2016-09-14 ENCOUNTER — Inpatient Hospital Stay (HOSPITAL_COMMUNITY)
Admission: AD | Admit: 2016-09-14 | Discharge: 2016-09-14 | Disposition: A | Discharge status: Home / Self Care | Payer: Medicaid Other | Source: Ambulatory Visit | Attending: Family Medicine | Admitting: Family Medicine

## 2016-09-14 DIAGNOSIS — K529 Noninfective gastroenteritis and colitis, unspecified: Secondary | ICD-10-CM

## 2016-09-14 DIAGNOSIS — Z87891 Personal history of nicotine dependence: Secondary | ICD-10-CM | POA: Diagnosis not present

## 2016-09-14 DIAGNOSIS — Z809 Family history of malignant neoplasm, unspecified: Secondary | ICD-10-CM | POA: Insufficient documentation

## 2016-09-14 DIAGNOSIS — O9989 Other specified diseases and conditions complicating pregnancy, childbirth and the puerperium: Secondary | ICD-10-CM | POA: Diagnosis not present

## 2016-09-14 DIAGNOSIS — Z3A39 39 weeks gestation of pregnancy: Secondary | ICD-10-CM | POA: Diagnosis not present

## 2016-09-14 DIAGNOSIS — R42 Dizziness and giddiness: Secondary | ICD-10-CM | POA: Insufficient documentation

## 2016-09-14 DIAGNOSIS — Z3403 Encounter for supervision of normal first pregnancy, third trimester: Secondary | ICD-10-CM

## 2016-09-14 DIAGNOSIS — O26893 Other specified pregnancy related conditions, third trimester: Secondary | ICD-10-CM | POA: Diagnosis not present

## 2016-09-14 HISTORY — DX: Noninfective gastroenteritis and colitis, unspecified: K52.9

## 2016-09-14 LAB — URINALYSIS, ROUTINE W REFLEX MICROSCOPIC
Bilirubin Urine: NEGATIVE
Glucose, UA: NEGATIVE mg/dL
Hgb urine dipstick: NEGATIVE
Ketones, ur: NEGATIVE mg/dL
LEUKOCYTES UA: NEGATIVE
NITRITE: NEGATIVE
PH: 6 (ref 5.0–8.0)
Protein, ur: NEGATIVE mg/dL
SPECIFIC GRAVITY, URINE: 1.021 (ref 1.005–1.030)

## 2016-09-14 MED ORDER — ONDANSETRON 8 MG PO TBDP
8.0000 mg | ORAL_TABLET | Freq: Once | ORAL | Status: AC
  Administered 2016-09-14: 8 mg via ORAL
  Filled 2016-09-14: qty 1

## 2016-09-14 MED ORDER — ONDANSETRON 4 MG PO TBDP: 4.0000 mg | ORAL_TABLET | Freq: Three times a day (TID) | ORAL | 0 refills | Status: DC | PRN

## 2016-09-14 NOTE — MAU Provider Note (Signed)
History     CSN: [id number]  Arrival date and time: 09/14/16 1252   None     Chief Complaint  Patient presents with  . Emesis  . Nausea  . Contractions  . Dizziness  . Diarrhea   Ms. Casey Quinn is a 26 yo G5P4004 at 39.[redacted] wks gestation presenting with complaints of N/V/D, dizziness, and H/A. She reports she started having diarrhea last night, but vomiting didn't start until 0630 today.  She reports 6-7 episodes of vomiting; "unable to keep anything down." She reports 4-5 episodes of diarrhea since last night. The dizziness & H/A she had earlier is resolved at this time.  She receives her [facility] and was last seen Tuesday 5/22.  She reports no ill contacts that she is aware of; none of her 4 children are sick at home.  She called [facility] to report her sx's and was told to come in.  She did not come in immediately, because she thought she might have the sx's because labor was about to start.  When her UC's did not get any closer or painful, she came to MAU for evaluation. She report (+) FM all day.  Emesis   This is a new problem. The current episode started today (@ 0630). The problem occurs 5 to 10 times per day. The problem has been gradually improving. The emesis has an appearance of bile. There has been no fever. Associated symptoms include abdominal pain (irregular UC's) and diarrhea. She has tried nothing for the symptoms.  Diarrhea   This is a new problem. The current episode started yesterday. The problem occurs 2 to 4 times per day. The problem has been gradually improving. Diarrhea characteristics: loose, not watery. The patient states that diarrhea does not awaken her from sleep. Associated symptoms include abdominal pain (irregular UC's) and vomiting. Nothing aggravates the symptoms. There are no known risk factors. She has tried nothing for the symptoms.     Past Medical History:  Diagnosis Date  . Headache     Past Surgical History:  Procedure Laterality Date  .  NO PAST SURGERIES      Family History  Problem Relation Age of Onset  . Cancer Sister     Social History  Substance Use Topics  . Smoking status: Former Games developer  . Smokeless tobacco: Never Used  . Alcohol use No    Allergies: No Known Allergies  Prescriptions Prior to Admission  Medication Sig Dispense Refill Last Dose  . Pediatric Multiple Vit-C-FA (FLINSTONES GUMMIES OMEGA-3 DHA) CHEW Chew 2 tablets by mouth daily.   Taking  . Prenatal Vit-Fe Fumarate-FA (MULTIVITAMIN-PRENATAL) 27-0.8 MG TABS tablet Take 1 tablet by mouth daily at 12 noon.   Taking    Review of Systems  Constitutional: Negative.   HENT: Negative.   Eyes: Negative.   Respiratory: Negative.   Cardiovascular: Negative.   Gastrointestinal: Positive for abdominal pain (irregular UC's), diarrhea and vomiting.  Endocrine: Negative.   Genitourinary: Negative.  Negative for pelvic pain and vaginal bleeding.  Musculoskeletal: Negative.   Skin: Negative.   Allergic/Immunologic: Negative.   Neurological: Negative.   Hematological: Negative.   Psychiatric/Behavioral: Negative.    Physical Exam   Blood pressure 114/62, pulse 80, temperature 97.9 F (36.6 C), temperature source Oral, resp. rate 16, height 5\' 2"  (1.575 m), weight 85.7 kg (189 lb 0.6 oz), last menstrual period 12/16/2015, SpO2 99 %, unknown if currently breastfeeding.  Physical Exam  Constitutional: She is oriented to person, place, and  time. She appears well-developed and well-nourished.  HENT:  Head: Normocephalic.  Eyes: Pupils are equal, round, and reactive to light.  Neck: Normal range of motion.  Cardiovascular: Normal rate, regular rhythm, normal heart sounds and intact distal pulses.   Respiratory: Effort normal and breath sounds normal.  GI: Soft. Bowel sounds are normal.  Genitourinary:  Genitourinary Comments: Gravid, S=D Cx: FT/long/soft/vtx/-3  Musculoskeletal: Normal range of motion.  Neurological: She is alert and oriented to  person, place, and time. She has normal reflexes.  Skin: Skin is warm and dry.  Psychiatric: She has a normal mood and affect. Her behavior is normal. Judgment and thought content normal.   CEFM  FHR: 125 bpm / moderate variability / accels present / decels absent TOCO: irregular UC's  Results for orders placed or performed during the hospital encounter of 09/14/16 (from the past 24 hour(s))  Urinalysis, Routine w reflex microscopic     Status: None   Collection Time: 09/14/16  2:43 PM  Result Value Ref Range   Color, Urine YELLOW YELLOW   APPearance CLEAR CLEAR   Specific Gravity, Urine 1.021 1.005 - 1.030   pH 6.0 5.0 - 8.0   Glucose, UA NEGATIVE NEGATIVE mg/dL   Hgb urine dipstick NEGATIVE NEGATIVE   Bilirubin Urine NEGATIVE NEGATIVE   Ketones, ur NEGATIVE NEGATIVE mg/dL   Protein, ur NEGATIVE NEGATIVE mg/dL   Nitrite NEGATIVE NEGATIVE   Leukocytes, UA NEGATIVE NEGATIVE   MAU Course  Procedures  MDM [facility] Zofran 8 mg ODT - improved N/V, able to keep down gingerale and crackers  Assessment and Plan  25 yo G5P4004 at 39.[redacted] wks gestation Gastroenteritis - Rx for Zofran 4mg  ODT every 8 hrs prn N/V - B.R.A.T. Diet - Advised likely over the worst of her sx's since no vomiting or diarrhea since arriving to MAU - Return if sx's worsen or not improved Encounter for supervision of normal first pregnancy in third trimester - Discharge home - Labor instructions given - Keep scheduled appt 09/20/2016 - Return to MAU for labor evaluation or any emergencies Patient verbalized an understanding of the plan of care and agrees.   Raelyn Moraolitta Bonnie Overdorf MSN, CNM 09/14/2016, 3:59 PM

## 2016-09-14 NOTE — Discharge Instructions (Signed)
Bland Diet A bland diet consists of foods that do not have a lot of fat or fiber. Foods without fat or fiber are easier for the body to digest. They are also less likely to irritate your mouth, throat, stomach, and other parts of your gastrointestinal tract. A bland diet is sometimes called a BRAT diet. What is my plan? Your health care provider or dietitian may recommend specific changes to your diet to prevent and treat your symptoms, such as:  Eating small meals often.  Cooking food until it is soft enough to chew easily.  Chewing your food well.  Drinking fluids slowly.  Not eating foods that are very spicy, sour, or fatty.  Not eating citrus fruits, such as oranges and grapefruit. What do I need to know about this diet?  Eat a variety of foods from the bland diet food list.  Do not follow a bland diet longer than you have to.  Ask your health care provider whether you should take vitamins. What foods can I eat? Grains   Hot cereals, such as cream of wheat. Bread, crackers, or tortillas made from refined white flour. Rice. Vegetables  Canned or cooked vegetables. Mashed or boiled potatoes. Fruits  Bananas. Applesauce. Other types of cooked or canned fruit with the skin and seeds removed, such as canned peaches or pears. Meats and Other Protein Sources  Scrambled eggs. Creamy peanut butter or other nut butters. Lean, well-cooked meats, such as chicken or fish. Tofu. Soups or broths. Dairy  Low-fat dairy products, such as milk, cottage cheese, or yogurt. Beverages  Water. Herbal tea. Apple juice. Sweets and Desserts  Pudding. Custard. Fruit gelatin. Ice cream. Fats and Oils  Mild salad dressings. Canola or olive oil. The items listed above may not be a complete list of allowed foods or beverages. Contact your dietitian for more options.  What foods are not recommended? Foods and ingredients that are often not recommended include:  Spicy foods, such as hot sauce or  salsa.  Fried foods.  Sour foods, such as pickled or fermented foods.  Raw vegetables or fruits, especially citrus or berries.  Caffeinated drinks.  Alcohol.  Strongly flavored seasonings or condiments. The items listed above may not be a complete list of foods and beverages that are not allowed. Contact your dietitian for more information.  This information is not intended to replace advice given to you by your health care provider. Make sure you discuss any questions you have with your health care provider. Document Released: 08/03/2015 Document Revised: 09/17/2015 Document Reviewed: 04/23/2014 Elsevier Interactive Patient Education  2017 Elsevier Inc. Normal Labor and Delivery  Your caregiver must first be sure you are in labor. Signs of labor include:   You may pass what is called "the mucus plug" before labor begins. This is a small amount of blood stained mucus.  Regular uterine contractions.  The time between contractions get closer together.  The discomfort and pain gradually gets more intense.  Pains are mostly located in the back.  Pains get worse when walking.  The cervix (the opening of the uterus becomes thinner (begins to efface) and opens up (dilates).   Once you are in labor and admitted into the hospital or care center, your caregiver will do the following:   A complete physical examination.  Check your vital signs (blood pressure, pulse, temperature and the fetal heart rate).  Do a vaginal examination (using a sterile glove and lubricant) to determine:  The position (presentation) of the  baby (head [vertex] or buttock first).  The level (station) of the baby's head in the birth canal.  The effacement and dilatation of the cervix.  An electronic monitor is usually placed on your abdomen. The monitor follows the length and intensity of the contractions, as well as the baby's heart rate.  your caregiver may insert an IV in your arm with a bottle  of sugar water. This is done as a precaution so that medications can be given to you quickly during labor or delivery.   NORMAL LABOR AND DELIVERY IS DIVIDED UP INTO 3 STAGES:  First Stage This is when regular contractions begin and the cervix begins to efface and dilate. This stage can last from 3 to 15 hours. The end of the first stage is when the cervix is 100% effaced and 10 centimeters dilated. Pain medications may be given by   Injection (morphine, demerol, etc.)  Regional anesthesia (spinal, caudal or epidural, anesthetics given in different locations of the spine). Paracervical pain medication may be given, which is an injection of and anesthetic on each side of the cervix. A pregnant woman may request to have "Natural Childbirth" which is not to have any medications or anesthesia during her labor and delivery.  Second Stage This is when the baby comes down through the birth canal (vagina) and is born. This can take 1 to 4 hours. As the baby's head comes down through the birth canal, you may feel like you are going to have a bowel movement. You will get the urge to bear down and push until the baby is delivered. As the baby's head is being delivered, the caregiver will decide if an episiotomy (a cut in the perineum and vagina area) is needed to prevent tearing of the tissue in this area. The episiotomy is sewn up after the delivery of the baby and placenta. Sometimes a mask with nitrous oxide is given for the mother to breath during the delivery of the baby to help if there is too much pain. The end of Stage 2 is when the baby is fully delivered. Then when the umbilical cord stops pulsating it is clamped and cut.  Third Stage The third stage begins after the baby is completely delivered and ends after the placenta (afterbirth) is delivered. This usually takes 5 to 30 minutes. After the placenta is delivered, a medication is given either by intravenous or injection to help contract the uterus  and prevent bleeding. The third stage is not painful and pain medication is usually not necessary. If there was a tear, it is repaired at this time. After the delivery, the mother is watched and monitored closely for 1 to 2 hours to make sure there is no postpartum bleeding (hemorrhage). If there is a lot of bleeding, medication is given to contract the uterus and stop the bleeding.   Document Released: 01/19/2008 Document Revised: 07/04/2011 Document Reviewed: 01/19/2008 [facility] Patient Information 2013 [employer], [place].

## 2016-09-14 NOTE — MAU Note (Signed)
Patent woke up today with :  +contractions--comes and goes + N/V --6-7 emesis episodes since getting up this am; unable to hold anything to eat or drink +dizziness +diarrhea --started last night; 4-5 episodes  Denies LOF, VB at this time. +FM

## 2016-09-20 ENCOUNTER — Ambulatory Visit (INDEPENDENT_AMBULATORY_CARE_PROVIDER_SITE_OTHER): Payer: Medicaid Other | Admitting: Family Medicine

## 2016-09-20 VITALS — BP 126/87 | HR 76 | Wt 191.0 lb

## 2016-09-20 DIAGNOSIS — O361910 Maternal care for other isoimmunization, first trimester, not applicable or unspecified: Secondary | ICD-10-CM

## 2016-09-20 DIAGNOSIS — Z3403 Encounter for supervision of normal first pregnancy, third trimester: Secondary | ICD-10-CM

## 2016-09-20 DIAGNOSIS — O09892 Supervision of other high risk pregnancies, second trimester: Secondary | ICD-10-CM

## 2016-09-20 NOTE — Patient Instructions (Addendum)
Third Trimester of Pregnancy The third trimester is from week 29 through week 42, months 7 through 9. This trimester is when your unborn baby (fetus) is growing very fast. At the end of the ninth month, the unborn baby is about 20 inches in length. It weighs about 6-10 pounds. Follow these instructions at home:  Avoid all smoking, herbs, and alcohol. Avoid drugs not approved by your doctor.  Do not use any tobacco products, including cigarettes, chewing tobacco, and electronic cigarettes. If you need help quitting, ask your doctor. You may get counseling or other support to help you quit.  Only take medicine as told by your doctor. Some medicines are safe and some are not during pregnancy.  Exercise only as told by your doctor. Stop exercising if you start having cramps.  Eat regular, healthy meals.  Wear a good support bra if your breasts are tender.  Do not use hot tubs, steam rooms, or saunas.  Wear your seat belt when driving.  Avoid raw meat, uncooked cheese, and liter boxes and soil used by cats.  Take your prenatal vitamins.  Take 1500-2000 milligrams of calcium daily starting at the 20th week of pregnancy until you deliver your baby.  Try taking medicine that helps you poop (stool softener) as needed, and if your doctor approves. Eat more fiber by eating fresh fruit, vegetables, and whole grains. Drink enough fluids to keep your pee (urine) clear or pale yellow.  Take warm water baths (sitz baths) to soothe pain or discomfort caused by hemorrhoids. Use hemorrhoid cream if your doctor approves.  If you have puffy, bulging veins (varicose veins), wear support hose. Raise (elevate) your feet for 15 minutes, 3-4 times a day. Limit salt in your diet.  Avoid heavy lifting, wear low heels, and sit up straight.  Rest with your legs raised if you have leg cramps or low back pain.  Visit your dentist if you have not gone during your pregnancy. Use a soft toothbrush to brush your  teeth. Be gentle when you floss.  You can have sex (intercourse) unless your doctor tells you not to.  Do not travel far distances unless you must. Only do so with your doctor's approval.  Take prenatal classes.  Practice driving to the hospital.  Pack your hospital bag.  Prepare the baby's room.  Go to your doctor visits. Get help if:  You are not sure if you are in labor or if your water has broken.  You are dizzy.  You have mild cramps or pressure in your lower belly (abdominal).  You have a nagging pain in your belly area.  You continue to feel sick to your stomach (nauseous), throw up (vomit), or have watery poop (diarrhea).  You have bad smelling fluid coming from your vagina.  You have pain with peeing (urination). Get help right away if:  You have a fever.  You are leaking fluid from your vagina.  You are spotting or bleeding from your vagina.  You have severe belly cramping or pain.  You lose or gain weight rapidly.  You have trouble catching your breath and have chest pain.  You notice sudden or extreme puffiness (swelling) of your face, hands, ankles, feet, or legs.  You have not felt the baby move in over an hour.  You have severe headaches that do not go away with medicine.  You have vision changes. This information is not intended to replace advice given to you by your health care provider. Make   sure you discuss any questions you have with your health care provider. Document Released: 07/06/2009 Document Revised: 09/17/2015 Document Reviewed: 06/12/2012 Elsevier Interactive Patient Education  2017 [employer].    Contraception Choices Contraception, also called birth control, means things to use or ways to try not to get pregnant. Hormonal birth control  This kind of birth control uses hormones. Here are some types of hormonal birth control:  A tube that is put under skin of the arm (implant). The tube can stay in for as long as 3  years.  Shots to get every 3 months (injections).  Pills to take every day (birth control pills).  A patch to change 1 time each week for 3 weeks (birth control patch). After that, the patch is taken off for 1 week.  A ring to put in the vagina. The ring is left in for 3 weeks. Then it is taken out of the vagina for 1 week. Then a new ring is put in.  Pills to take after unprotected sex (emergency birth control pills). Barrier birth control  Here are some types of barrier birth control:  A thin covering that is put on the penis before sex (female condom). The covering is thrown away after sex.  A soft, loose covering that is put in the vagina before sex (female condom). The covering is thrown away after sex.  A rubber bowl that sits over the cervix (diaphragm). The bowl must be made for you. The bowl is put into the vagina before sex. The bowl is left in for 6-8 hours after sex. It is taken out within 24 hours.  A small, soft cup that fits over the cervix (cervical cap). The cup must be made for you. The cup can be left in for 6-8 hours after sex. It is taken out within 48 hours.  A sponge that is put into the vagina before sex. It must be left in for at least 6 hours after sex. It must be taken out within 30 hours. Then it is thrown away.  A chemical that kills or stops sperm from getting into the uterus (spermicide). It may be a pill, cream, jelly, or foam to put in the vagina. The chemical should be used at least 10-15 minutes before sex. IUD (intrauterine) birth control An IUD is a small, T-shaped piece of plastic. It is put inside the uterus. There are two kinds:  Hormone IUD. This kind can stay in for 3-5 years.  Copper IUD. This kind can stay in for 10 years. Permanent birth control Here are some types of permanent birth control:  Surgery to block the fallopian tubes.  Having an insert put into each fallopian tube.  Surgery to tie off the tubes that carry sperm  (vasectomy). Natural planning birth control Here are some types of natural planning birth control:  Not having sex on the days the woman could get pregnant.  Using a calendar:  To keep track of the length of each period.  To find out what days pregnancy can happen.  To plan to not have sex on days when pregnancy can happen.  Watching for symptoms of ovulation and not having sex during ovulation. One way the woman can check for ovulation is to check her temperature.  Waiting to have sex until after ovulation. Summary  Contraception, also called birth control, means things to use or ways to try not to get pregnant.  Hormonal methods of birth control include implants, injections, pills, patches, vaginal  rings, and emergency birth control pills.  Barrier methods of birth control can include female condoms, female condoms, diaphragms, cervical caps, sponges, and spermicides.  There are two types of IUD (intrauterine device) birth control. An IUD can be put in a woman's uterus to prevent pregnancy for 3-5 years.  Permanent sterilization can be done through a procedure for males, females, or both.  Natural planning methods involve not having sex on the days when the woman could get pregnant. This information is not intended to replace advice given to you by your health care provider. Make sure you discuss any questions you have with your health care provider. Document Released: 02/06/2009 Document Revised: 04/21/2016 Document Reviewed: 04/21/2016 Elsevier Interactive Patient Education  2017 [employer]

## 2016-09-20 NOTE — Progress Notes (Signed)
      Subjective:  Casey Quinn is a 26 y.o. [id number] at 5556w6d being seen today for ongoing prenatal care.  She is currently monitored for the following issues for this low-risk pregnancy and has Supervision of normal pregnancy; Maternal atypical antibody; Short interval between pregnancies affecting pregnancy in second trimester, antepartum; and Gastroenteritis on her problem list.  Patient reports no complaints.  Contractions: Irregular.  .  Movement: Present. Denies leaking of fluid.   The following portions of the patient's history were reviewed and updated as appropriate: allergies, current medications, past family history, past medical history, past social history, past surgical history and problem list. Problem list updated.  Objective:   Vitals:   09/20/16 1314  BP: 126/87  Pulse: 76  Weight: 191 lb (86.6 kg)    Fetal Status: Fetal Heart Rate (bpm): 135 Fundal Height: 38 cm Movement: Present      General:  Alert, oriented and cooperative. Patient is in no acute distress.  Skin: Skin is warm and dry. No rash noted.   Cardiovascular: Normal heart rate noted  Respiratory: Normal respiratory effort, no problems with respiration noted  Abdomen: Soft, gravid, appropriate for gestational age. Pain/Pressure: Present     Pelvic:  Cervical exam performed Dilation: 2 Effacement (%): 70 Station: Ballotable  Extremities: Normal range of motion.     Mental Status: Normal mood and affect. Normal behavior. Normal judgment and thought content.   Urinalysis:      Assessment and Plan:  Pregnancy: G5P4004 at 4456w6d  1. Encounter for supervision of normal first pregnancy in third trimester  2. Short interval between pregnancies affecting pregnancy in second trimester, antepartum - Desires [facility]  3. Maternal atypical antibody affecting pregnancy in first trimester, single or unspecified fetus  MOC: [facility] MOF: Breastfeeding Term labor symptoms and general obstetric precautions including but  not limited to vaginal bleeding, contractions, leaking of fluid and fetal movement were reviewed in detail with the patient. Please refer to After Visit Summary for other counseling recommendations.  Return in about 1 week (around 09/27/2016) for Routine OB visit.   Jen MowElizabeth Mumaw, DO OB Fellow [facility], [facility]

## 2016-09-21 ENCOUNTER — Encounter (HOSPITAL_COMMUNITY): Payer: Self-pay | Admitting: *Deleted

## 2016-09-21 ENCOUNTER — Inpatient Hospital Stay (HOSPITAL_COMMUNITY): Payer: Medicaid Other | Admitting: Anesthesiology

## 2016-09-21 ENCOUNTER — Inpatient Hospital Stay (HOSPITAL_COMMUNITY)
Admission: AD | Admit: 2016-09-21 | Discharge: 2016-09-23 | DRG: 775 | Disposition: A | Discharge status: Home / Self Care | Payer: Medicaid Other | Source: Ambulatory Visit | Attending: Family Medicine | Admitting: Family Medicine

## 2016-09-21 DIAGNOSIS — Z141 Cystic fibrosis carrier: Secondary | ICD-10-CM

## 2016-09-21 DIAGNOSIS — Z3403 Encounter for supervision of normal first pregnancy, third trimester: Secondary | ICD-10-CM

## 2016-09-21 DIAGNOSIS — Z3493 Encounter for supervision of normal pregnancy, unspecified, third trimester: Secondary | ICD-10-CM | POA: Diagnosis present

## 2016-09-21 DIAGNOSIS — Z87891 Personal history of nicotine dependence: Secondary | ICD-10-CM | POA: Diagnosis not present

## 2016-09-21 DIAGNOSIS — Z3A40 40 weeks gestation of pregnancy: Secondary | ICD-10-CM

## 2016-09-21 DIAGNOSIS — K529 Noninfective gastroenteritis and colitis, unspecified: Secondary | ICD-10-CM

## 2016-09-21 DIAGNOSIS — O99824 Streptococcus B carrier state complicating childbirth: Secondary | ICD-10-CM | POA: Diagnosis not present

## 2016-09-21 LAB — CBC
HEMATOCRIT: 29.1 % — AB (ref 36.0–46.0)
Hemoglobin: 9.5 g/dL — ABNORMAL LOW (ref 12.0–15.0)
MCH: 22.7 pg — AB (ref 26.0–34.0)
MCHC: 32.6 g/dL (ref 30.0–36.0)
MCV: 69.6 fL — AB (ref 78.0–100.0)
Platelets: 188 10*3/uL (ref 150–400)
RBC: 4.18 MIL/uL (ref 3.87–5.11)
RDW: 17.5 % — ABNORMAL HIGH (ref 11.5–15.5)
WBC: 10.5 10*3/uL (ref 4.0–10.5)

## 2016-09-21 LAB — RPR: RPR Ser Ql: NONREACTIVE

## 2016-09-21 MED ORDER — TETANUS-DIPHTH-ACELL PERTUSSIS 5-2.5-18.5 LF-MCG/0.5 IM SUSP: 0.5000 mL | Freq: Once | INTRAMUSCULAR | Status: DC

## 2016-09-21 MED ORDER — ONDANSETRON HCL 4 MG PO TABS: 4.0000 mg | ORAL_TABLET | ORAL | Status: DC | PRN

## 2016-09-21 MED ORDER — EPHEDRINE 5 MG/ML INJ
10.0000 mg | INTRAVENOUS | Status: DC | PRN
  Filled 2016-09-21: qty 2

## 2016-09-21 MED ORDER — LACTATED RINGERS IV SOLN: 500.0000 mL | INTRAVENOUS | Status: DC | PRN

## 2016-09-21 MED ORDER — PHENYLEPHRINE 40 MCG/ML (10ML) SYRINGE FOR IV PUSH (FOR BLOOD PRESSURE SUPPORT)
PREFILLED_SYRINGE | INTRAVENOUS | Status: AC
  Filled 2016-09-21: qty 20

## 2016-09-21 MED ORDER — WITCH HAZEL-GLYCERIN EX PADS: 1.0000 "application " | MEDICATED_PAD | CUTANEOUS | Status: DC | PRN

## 2016-09-21 MED ORDER — PHENYLEPHRINE 40 MCG/ML (10ML) SYRINGE FOR IV PUSH (FOR BLOOD PRESSURE SUPPORT)
80.0000 ug | PREFILLED_SYRINGE | INTRAVENOUS | Status: DC | PRN
  Filled 2016-09-21: qty 5

## 2016-09-21 MED ORDER — COCONUT OIL OIL: 1.0000 "application " | TOPICAL_OIL | Status: DC | PRN

## 2016-09-21 MED ORDER — ACETAMINOPHEN 325 MG PO TABS
650.0000 mg | ORAL_TABLET | ORAL | Status: DC | PRN
  Administered 2016-09-21 – 2016-09-22 (×2): 650 mg via ORAL
  Filled 2016-09-21 (×2): qty 2

## 2016-09-21 MED ORDER — PENICILLIN G POT IN DEXTROSE 60000 UNIT/ML IV SOLN
3.0000 10*6.[IU] | INTRAVENOUS | Status: DC
  Administered 2016-09-21: 3 10*6.[IU] via INTRAVENOUS
  Filled 2016-09-21 (×5): qty 50

## 2016-09-21 MED ORDER — PENICILLIN G POTASSIUM 5000000 UNITS IJ SOLR
5.0000 10*6.[IU] | Freq: Once | INTRAVENOUS | Status: AC
  Administered 2016-09-21: 5 10*6.[IU] via INTRAVENOUS
  Filled 2016-09-21: qty 5

## 2016-09-21 MED ORDER — SIMETHICONE 80 MG PO CHEW: 80.0000 mg | CHEWABLE_TABLET | ORAL | Status: DC | PRN

## 2016-09-21 MED ORDER — OXYTOCIN BOLUS FROM INFUSION
500.0000 mL | Freq: Once | INTRAVENOUS | Status: AC
  Administered 2016-09-21: 500 mL via INTRAVENOUS

## 2016-09-21 MED ORDER — LIDOCAINE HCL (PF) 1 % IJ SOLN
INTRAMUSCULAR | Status: DC | PRN
  Administered 2016-09-21 (×2): 5 mL via EPIDURAL

## 2016-09-21 MED ORDER — ONDANSETRON HCL 4 MG/2ML IJ SOLN: 4.0000 mg | Freq: Four times a day (QID) | INTRAMUSCULAR | Status: DC | PRN

## 2016-09-21 MED ORDER — TERBUTALINE SULFATE 1 MG/ML IJ SOLN
0.2500 mg | Freq: Once | INTRAMUSCULAR | Status: DC | PRN
  Filled 2016-09-21: qty 1

## 2016-09-21 MED ORDER — DIBUCAINE 1 % RE OINT: 1.0000 "application " | TOPICAL_OINTMENT | RECTAL | Status: DC | PRN

## 2016-09-21 MED ORDER — LACTATED RINGERS IV SOLN: 500.0000 mL | Freq: Once | INTRAVENOUS | Status: DC

## 2016-09-21 MED ORDER — FENTANYL 2.5 MCG/ML BUPIVACAINE 1/10 % EPIDURAL INFUSION (WH - ANES)
INTRAMUSCULAR | Status: AC
  Filled 2016-09-21: qty 100

## 2016-09-21 MED ORDER — LIDOCAINE HCL (PF) 1 % IJ SOLN
30.0000 mL | INTRAMUSCULAR | Status: DC | PRN
  Filled 2016-09-21: qty 30

## 2016-09-21 MED ORDER — ACETAMINOPHEN 325 MG PO TABS: 650.0000 mg | ORAL_TABLET | ORAL | Status: DC | PRN

## 2016-09-21 MED ORDER — OXYCODONE-ACETAMINOPHEN 5-325 MG PO TABS: 2.0000 | ORAL_TABLET | ORAL | Status: DC | PRN

## 2016-09-21 MED ORDER — ONDANSETRON HCL 4 MG/2ML IJ SOLN: 4.0000 mg | INTRAMUSCULAR | Status: DC | PRN

## 2016-09-21 MED ORDER — LACTATED RINGERS IV SOLN: INTRAVENOUS | Status: DC

## 2016-09-21 MED ORDER — MISOPROSTOL 200 MCG PO TABS
ORAL_TABLET | ORAL | Status: AC
  Filled 2016-09-21: qty 1

## 2016-09-21 MED ORDER — BENZOCAINE-MENTHOL 20-0.5 % EX AERO: 1.0000 "application " | INHALATION_SPRAY | CUTANEOUS | Status: DC | PRN

## 2016-09-21 MED ORDER — OXYTOCIN 40 UNITS IN LACTATED RINGERS INFUSION - SIMPLE MED
2.5000 [IU]/h | INTRAVENOUS | Status: DC
  Filled 2016-09-21: qty 1000

## 2016-09-21 MED ORDER — MISOPROSTOL 200 MCG PO TABS
200.0000 ug | ORAL_TABLET | Freq: Once | ORAL | Status: AC
  Administered 2016-09-21: 200 ug via RECTAL

## 2016-09-21 MED ORDER — SENNOSIDES-DOCUSATE SODIUM 8.6-50 MG PO TABS
2.0000 | ORAL_TABLET | ORAL | Status: DC
  Administered 2016-09-21 – 2016-09-22 (×2): 2 via ORAL
  Filled 2016-09-21 (×2): qty 2

## 2016-09-21 MED ORDER — IBUPROFEN 600 MG PO TABS
600.0000 mg | ORAL_TABLET | Freq: Four times a day (QID) | ORAL | Status: DC
  Administered 2016-09-21 – 2016-09-22 (×6): 600 mg via ORAL
  Filled 2016-09-21 (×7): qty 1

## 2016-09-21 MED ORDER — OXYTOCIN 40 UNITS IN LACTATED RINGERS INFUSION - SIMPLE MED
1.0000 m[IU]/min | INTRAVENOUS | Status: DC
  Administered 2016-09-21: 2 m[IU]/min via INTRAVENOUS

## 2016-09-21 MED ORDER — FLEET ENEMA 7-19 GM/118ML RE ENEM: 1.0000 | ENEMA | Freq: Once | RECTAL | Status: DC

## 2016-09-21 MED ORDER — FENTANYL 2.5 MCG/ML BUPIVACAINE 1/10 % EPIDURAL INFUSION (WH - ANES)
14.0000 mL/h | INTRAMUSCULAR | Status: DC | PRN
  Administered 2016-09-21 (×2): 14 mL/h via EPIDURAL
  Filled 2016-09-21: qty 100

## 2016-09-21 MED ORDER — DIPHENHYDRAMINE HCL 25 MG PO CAPS: 25.0000 mg | ORAL_CAPSULE | Freq: Four times a day (QID) | ORAL | Status: DC | PRN

## 2016-09-21 MED ORDER — ZOLPIDEM TARTRATE 5 MG PO TABS: 5.0000 mg | ORAL_TABLET | Freq: Every evening | ORAL | Status: DC | PRN

## 2016-09-21 MED ORDER — PRENATAL MULTIVITAMIN CH
1.0000 | ORAL_TABLET | Freq: Every day | ORAL | Status: DC
  Administered 2016-09-22: 1 via ORAL
  Filled 2016-09-21: qty 1

## 2016-09-21 MED ORDER — OXYCODONE-ACETAMINOPHEN 5-325 MG PO TABS: 1.0000 | ORAL_TABLET | ORAL | Status: DC | PRN

## 2016-09-21 MED ORDER — DIPHENHYDRAMINE HCL 50 MG/ML IJ SOLN: 12.5000 mg | INTRAMUSCULAR | Status: DC | PRN

## 2016-09-21 MED ORDER — SOD CITRATE-CITRIC ACID 500-334 MG/5ML PO SOLN: 30.0000 mL | ORAL | Status: DC | PRN

## 2016-09-21 NOTE — H&P (Signed)
Casey Quinn is a 26 y.o. female [phone] with IUP at 9340w0d presenting for SOL. Pt states she has been having irregular, every 5 minutes contractions, associated with scant staining vaginal bleeding for 0 hours..  Membranes are intact, with active fetal movement.   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 Gastroenteritis 09/14/2016  . Headache     Past Surgical History: Past Surgical History:  Procedure Laterality Date  . NO PAST SURGERIES      Obstetrical History: OB History    Gravida Para Term Preterm AB Living   5 4 4  0 0 4   SAB TAB Ectopic Multiple Live Births   0 0 0 0 4       Social History: Social History   Social History  . Marital status: Single    Spouse name: N/A  . Number of children: N/A  . Years of education: N/A   Social History Main Topics  . Smoking status: Former Games developermoker  . Smokeless tobacco: Never Used  . Alcohol use No  . Drug use: No  . Sexual activity: Yes    Birth control/ protection: None   Other Topics Concern  . None   Social History Narrative  . None    Family History: Family History  Problem Relation Age of Onset  . Cancer Sister     Allergies: No Known Allergies  Prescriptions Prior to Admission  Medication Sig Dispense Refill Last Dose  . Prenatal Vit-Fe Fumarate-FA (MULTIVITAMIN-PRENATAL) 27-0.8 MG TABS tablet Take 1 tablet by mouth daily at 12 noon.   Past Month at Unknown time  . ondansetron (ZOFRAN ODT) 4 MG disintegrating tablet Take 1 tablet (4 mg total) by mouth every 8 (eight) hours as needed for nausea or vomiting. (Patient not taking: Reported on 09/20/2016) 20 tablet 0 Not Taking        Review of Systems   Constitutional: Negative for fever and chills Eyes: Negative for visual disturbances Respiratory: Negative for shortness of breath, dyspnea Cardiovascular: Negative for chest pain or palpitations   Gastrointestinal: Negative for vomiting, diarrhea and constipation.  POSITIVE for abdominal pain (contractions) Genitourinary: Negative for dysuria and urgency Musculoskeletal: Negative for back pain, joint pain, myalgias  Neurological: Negative for dizziness and headaches      Blood pressure 104/65, pulse 80, resp. rate 18, height 5\' 3"  (1.6 m), weight 191 lb (86.6 kg), last menstrual period 12/16/2015, unknown if currently breastfeeding. General appearance: alert, cooperative and no distress Lungs: clear to auscultation bilaterally Heart: regular rate and rhythm Abdomen: soft, non-tender; bowel sounds normal Extremities: Homans sign is negative, no sign of DVT DTR's 2+ Presentation: cephalic Fetal monitoring  Baseline: 125 bpm Uterine activity  q5 Dilation: 4.5 Effacement (%): 90 Station: -2 Exam by:: Melburn PopperMichelle Williams, RN   Prenatal labs: ABO, Rh: --/--/A POS (05/30 0235) Antibody: POS (05/30 0235) Rubella: !Error! RPR: Non Reactive (03/26 0831)  HBsAg: NEGATIVE (11/29 0001)  HIV: Non Reactive (03/26 0831)  GBS: Positive (05/08 0000)  1 hr Glucola 117 Genetic screening  normal Anatomy US normal  Prenatal Transfer Tool  Maternal Diabetes: No Genetic Screening: Normal Maternal Ultrasounds/Referrals: Normal Fetal Ultrasounds or other Referrals:  None Maternal Substance Abuse:  No Significant Maternal Medications:  None Significant Maternal Lab Results: None     Results for orders placed or performed during the hospital encounter of 09/21/16 (from the past 24 hour(s))  CBC   Collection Time:  09/21/16  2:35 AM  Result Value Ref Range   WBC 10.5 4.0 - 10.5 K/uL   RBC 4.18 3.87 - 5.11 MIL/uL   Hemoglobin 9.5 (L) 12.0 - 15.0 g/dL   HCT 13.2 (L) 44.0 - 10.2 %   MCV 69.6 (L) 78.0 - 100.0 fL   MCH 22.7 (L) 26.0 - 34.0 pg   MCHC 32.6 30.0 - 36.0 g/dL   RDW 72.5 (H) 36.6 - 44.0 %   Platelets 188 150 - 400 K/uL  Type and screen [facility] [place]    Collection Time: 09/21/16  2:35 AM  Result Value Ref Range   ABO/RH(D) A POS    Antibody Screen POS    Sample Expiration 09/24/2016    Antibody Identification PENDING    DAT, IgG NEG     Assessment: Casey Quinn is a 26 y.o. [id number] with an IUP at [redacted]w[redacted]d presenting for SOL.  Plan: #Labor: expectant management #Pain:  Per request #FWB Cat 1 #ID: GBS: positive  #MOF:  breast #MOC:  unsure #Circ: outpatient   Charlesetta Garibaldi Cedric Mcclaine CNM 09/21/2016, 5:31 AM

## 2016-09-21 NOTE — Anesthesia Preprocedure Evaluation (Signed)
Anesthesia Evaluation  Patient identified by MRN, date of birth, ID band Patient awake    Reviewed: Allergy & Precautions, NPO status , Patient's Chart, lab work & pertinent test results  Airway Mallampati: II  TM Distance: >3 FB Neck ROM: Full    Dental no notable dental hx. (+) Dental Advisory Given   Pulmonary former smoker,    Pulmonary exam normal        Cardiovascular negative cardio ROS Normal cardiovascular exam     Neuro/Psych negative neurological ROS  negative psych ROS   GI/Hepatic negative GI ROS, Neg liver ROS,   Endo/Other  negative endocrine ROS  Renal/GU negative Renal ROS  negative genitourinary   Musculoskeletal negative musculoskeletal ROS (+)   Abdominal   Peds negative pediatric ROS (+)  Hematology negative hematology ROS (+)   Anesthesia Other Findings   Reproductive/Obstetrics (+) Pregnancy                             Anesthesia Physical Anesthesia Plan  ASA: II  Anesthesia Plan: Epidural   Post-op Pain Management:    Induction:   Airway Management Planned: Natural Airway  Additional Equipment:   Intra-op Plan:   Post-operative Plan:   Informed Consent: I have reviewed the patients History and Physical, chart, labs and discussed the procedure including the risks, benefits and alternatives for the proposed anesthesia with the patient or authorized representative who has indicated his/her understanding and acceptance.   Dental advisory given  Plan Discussed with: Anesthesiologist  Anesthesia Plan Comments:         Anesthesia Quick Evaluation

## 2016-09-21 NOTE — Anesthesia Pain Management Evaluation Note (Signed)
  CRNA Pain Management Visit Note  Patient: Casey Quinn, 26 y.o., female  "Hello [facility] am [facility]. We have an anesthesia team available at all times to provide care throughout the hospital, including epidural management and anesthesia for C-section. I don't know your plan for the delivery whether it a natural birth, water birth, IV sedation, nitrous supplementation, doula or epidural, but we want to meet your pain goals."   1.Was your pain managed to your expectations on prior hospitalizations?   Yes   2.What is your expectation for pain management during this hospitalization?     Epidural  3.How can we help you reach that goal? Epidural in place.  Record the patient's initial score and the patient's pain goal.   Pain: 0  Pain Goal: 8 prior to epidural placement. [facility] wants you to be able to say your pain was always managed very well.  Alexxis Mackert L 09/21/2016

## 2016-09-21 NOTE — Lactation Note (Signed)
This note was copied from a baby's chart. Lactation Consultation Note  Patient Name: Casey Cain Saupeboni Petteway ZOXWR'UToday's Date: 09/21/2016 Reason for consult: Initial assessment [facility]. [facility]. Basic teaching reviewed with Mom, encouraged to BF with feeding ques, 8-12 times or more in 24 hours. Lactation brochure left for review, advised of OP services and support group. Encouraged to call for assist as needed or questions/concerns.   Maternal Data Has patient been taught Hand Expression?: No (Mom exp BF and reports she knows how to hand express) Does the patient have breastfeeding experience prior to this delivery?: Yes  Feeding Feeding Type: Breast Fed  LATCH Score/Interventions Latch: Grasps breast easily, tongue down, lips flanged, rhythmical sucking.  Audible Swallowing: A few with stimulation  Type of Nipple: Everted at rest and after stimulation  Comfort (Breast/Nipple): Soft / non-tender     Hold (Positioning): No assistance needed to correctly position infant at breast.  LATCH Score: 9  Lactation Tools Discussed/Used WIC Program: No   Consult Status Consult Status: Follow-up Date: 09/22/16 Follow-up type: In-patient    Alfred LevinsGranger, Rosalie Gelpi Ann 09/21/2016, 12:47 PM

## 2016-09-21 NOTE — Anesthesia Postprocedure Evaluation (Signed)
Anesthesia Post Note  Patient: Casey Quinn  Procedure(s) Performed: * No procedures listed *  Patient location during evaluation: Mother Baby Anesthesia Type: Epidural Level of consciousness: awake, oriented and awake and alert Pain management: pain level controlled Vital Signs Assessment: post-procedure vital signs reviewed and stable Respiratory status: spontaneous breathing Cardiovascular status: stable Postop Assessment: no headache, patient able to bend at knees, no signs of nausea or vomiting and adequate PO intake (Some back discomfort that she is taking motrin for.) Anesthetic complications: no        Last Vitals:  Vitals:   09/21/16 1430 09/21/16 1527  BP: (!) 122/57 120/64  Pulse: (!) 57   Resp: 18 18  Temp: 36.8 C 36.4 C    Last Pain:  Vitals:   09/21/16 1527  TempSrc: Oral  PainSc: 4    Pain Goal:                 Casey Quinn

## 2016-09-21 NOTE — Anesthesia Procedure Notes (Addendum)
Epidural Patient location during procedure: OB Start time: 09/21/2016 3:17 AM End time: 09/21/2016 3:29 AM  Staffing Anesthesiologist: Heather RobertsSINGER, Casey Pilkenton Performed: anesthesiologist and other anesthesia staff   Preanesthetic Checklist Completed: patient identified, site marked, pre-op evaluation, timeout performed, IV checked, risks and benefits discussed and monitors and equipment checked  Epidural Patient position: sitting Prep: DuraPrep Patient monitoring: heart rate, cardiac monitor, continuous pulse ox and blood pressure Approach: midline Location: L2-L3 Injection technique: LOR saline  Needle:  Needle type: Tuohy  Needle gauge: 17 G Needle length: 9 cm Needle insertion depth: 6 cm Catheter size: 20 Guage Catheter at skin depth: 10 cm Test dose: negative and Other  Assessment Events: blood not aspirated, injection not painful, no injection resistance and negative IV test  Additional Notes Informed consent obtained prior to proceeding including risk of failure, 1% risk of PDPH, risk of minor discomfort and bruising.  Discussed rare but serious complications including epidural abscess, permanent nerve injury, epidural hematoma.  Discussed alternatives to epidural analgesia and patient desires to proceed.  Timeout performed pre-procedure verifying patient name, procedure, and platelet count.  Patient tolerated procedure well.

## 2016-09-21 NOTE — Progress Notes (Signed)
Patient ID: Cain Saupeboni Riva, female   DOB: [birth year], 26 y.o.   MRN: [id number]  S: Patient seen & examined for progress of labor. Patient comfortable with epidural.    O:  Vitals:   09/21/16 0836 09/21/16 0855 09/21/16 0939 09/21/16 1001  BP: (!) 93/54 (!) 87/49 103/64 107/63  Pulse: 68 67 98 76  Resp: 16 16 17 18   Temp:      TempSrc:      Weight:      Height:        Dilation: 7.5 Effacement (%): 90 Cervical Position: Middle Station: -3 Presentation: Vertex Exam by:: Gifford ShaveYancey Luft RN  Bag still intact, possible SROM with minimal clear fluid return ?forebag per Tresa ResYancey, RN   FHT: 115bpm, mod var, +accels, no decels TOCO: q2-564min   A/P: Pitocin started Continue expectant management Anticipate SVD

## 2016-09-22 MED ORDER — OXYCODONE HCL 5 MG PO TABS
5.0000 mg | ORAL_TABLET | ORAL | Status: DC | PRN
  Administered 2016-09-22 (×2): 5 mg via ORAL
  Filled 2016-09-22 (×2): qty 1

## 2016-09-22 NOTE — Progress Notes (Signed)
UR chart review completed.  

## 2016-09-22 NOTE — Progress Notes (Signed)
Post Partum Day #1 Subjective: no complaints, up ad lib, voiding, tolerating PO and reports normal lochia  Objective: Blood pressure (!) 105/56, pulse 68, temperature 97.8 F (36.6 C), temperature source Oral, resp. rate 16, height 5\' 3"  (1.6 m), weight 86.6 kg (191 lb), last menstrual period 12/16/2015, SpO2 99 %, unknown if currently breastfeeding.  Physical Exam:  General: alert Lochia: appropriate Uterine Fundus: firm DVT Evaluation: No evidence of DVT seen on physical exam.   Recent Labs  09/21/16 0235  HGB 9.5*  HCT 29.1*    Assessment/Plan: Plan for discharge tomorrow  Breast Undecided contraception   LOS: 1 day   Allie BossierMyra C Haiven Nardone 09/22/2016, 6:28 AM

## 2016-09-22 NOTE — Lactation Note (Signed)
This note was copied from a baby's chart. Lactation Consultation Note  Patient Name: Bobbe MedicoBoy Maudell Lezama ZOXWR'UToday's Date: 09/22/2016 Reason for consult: Follow-up assessment   Follow up with Exp BF mom of 28 hour old infant. Infant with 9 BF for 10-30 minutes, 5 voids and 5 stools since birth. Infant weight 6 lb 8.9 oz with 6% weight loss since birth. LATCH scores 8-9.   Mom reports feedings are going well. She denies nipple pain/tenderness. Mom reports her breasts are not feeling fuller today yet. MOm denies questions/concerns or need for assistance at this time. Enc mom to call out for feeding assistance prn.     Maternal Data Formula Feeding for Exclusion: No Has patient been taught Hand Expression?: Yes Does the patient have breastfeeding experience prior to this delivery?: Yes  Feeding Feeding Type: Breast Fed Length of feed: 15 min  LATCH Score/Interventions                      Lactation Tools Discussed/Used     Consult Status Consult Status: Follow-up Date: 09/23/16 Follow-up type: In-patient    Silas FloodSharon S Alexee Delsanto 09/22/2016, 4:19 PM

## 2016-09-23 MED ORDER — IBUPROFEN 600 MG PO TABS: 600.0000 mg | ORAL_TABLET | Freq: Four times a day (QID) | ORAL | 0 refills | Status: DC

## 2016-09-23 NOTE — Lactation Note (Signed)
This note was copied from a baby's chart. Lactation Consultation Note  Patient Name: Casey MedicoBoy Niesha Gilleland Quinn's Date: 09/23/2016 Reason for consult: Follow-up assessment  Baby 46 hours old. Mom reports that baby nursing well and her milk volume is increasing. Mom aware of OP/BFSG and LC phone line assistance after D/C.   Maternal Data    Feeding Feeding Type: Breast Fed Length of feed: 5 min  LATCH Score/Interventions                      Lactation Tools Discussed/Used     Consult Status Consult Status: PRN    Sherlyn HayJennifer D Leafy Motsinger 09/23/2016, 10:11 AM

## 2016-09-23 NOTE — Discharge Instructions (Signed)
Vaginal Delivery, Care After °Refer to this sheet in the next few weeks. These instructions provide you with information about caring for yourself after vaginal delivery. Your health care provider may also give you more specific instructions. Your treatment has been planned according to current medical practices, but problems sometimes occur. Call your health care provider if you have any problems or questions. °What can I expect after the procedure? °After vaginal delivery, it is common to have: °· Some bleeding from your vagina. °· Soreness in your abdomen, your vagina, and the area of skin between your vaginal opening and your anus (perineum). °· Pelvic cramps. °· Fatigue. ° °Follow these instructions at home: °Medicines °· Take over-the-counter and prescription medicines only as told by your health care provider. °· If you were prescribed an antibiotic medicine, take it as told by your health care provider. Do not stop taking the antibiotic until it is finished. °Driving ° °· Do not drive or operate heavy machinery while taking prescription pain medicine. °· Do not drive for 24 hours if you received a sedative. °Lifestyle °· Do not drink alcohol. This is especially important if you are breastfeeding or taking medicine to relieve pain. °· Do not use tobacco products, including cigarettes, chewing tobacco, or e-cigarettes. If you need help quitting, ask your health care provider. °Eating and drinking °· Drink at least 8 eight-ounce glasses of water every day unless you are told not to by your health care provider. If you choose to breastfeed your baby, you may need to drink more water than this. °· Eat high-fiber foods every day. These foods may help prevent or relieve constipation. High-fiber foods include: °? Whole grain cereals and breads. °? Brown rice. °? Beans. °? Fresh fruits and vegetables. °Activity °· Return to your normal activities as told by your health care provider. Ask your health care provider  what activities are safe for you. °· Rest as much as possible. Try to rest or take a nap when your baby is sleeping. °· Do not lift anything that is heavier than your baby or 10 lb (4.5 kg) until your health care provider says that it is safe. °· Talk with your health care provider about when you can engage in sexual activity. This may depend on your: °? Risk of infection. °? Rate of healing. °? Comfort and desire to engage in sexual activity. °Vaginal Care °· If you have an episiotomy or a vaginal tear, check the area every day for signs of infection. Check for: °? More redness, swelling, or pain. °? More fluid or blood. °? Warmth. °? Pus or a bad smell. °· Do not use tampons or douches until your health care provider says this is safe. °· Watch for any blood clots that may pass from your vagina. These may look like clumps of dark red, brown, or black discharge. °General instructions °· Keep your perineum clean and dry as told by your health care provider. °· Wear loose, comfortable clothing. °· Wipe from front to back when you use the toilet. °· Ask your health care provider if you can shower or take a bath. If you had an episiotomy or a perineal tear during labor and delivery, your health care provider may tell you not to take baths for a certain length of time. °· Wear a bra that supports your breasts and fits you well. °· If possible, have someone help you with household activities and help care for your baby for at least a few days after   at least a few days after you leave the hospital. °· Keep all follow-up visits for you and your baby as told by your health care provider. This is important. °Contact a health care provider if: °· You have: °¨ Vaginal discharge that has a bad smell. °¨ Difficulty urinating. °¨ Pain when urinating. °¨ A sudden increase or decrease in the frequency of your bowel movements. °¨ More redness, swelling, or pain around your episiotomy or vaginal tear. °¨ More fluid or blood coming from your episiotomy or  vaginal tear. °¨ Pus or a bad smell coming from your episiotomy or vaginal tear. °¨ A fever. °¨ A rash. °¨ Little or no interest in activities you used to enjoy. °¨ Questions about caring for yourself or your baby. °· Your episiotomy or vaginal tear feels warm to the touch. °· Your episiotomy or vaginal tear is separating or does not appear to be healing. °· Your breasts are painful, hard, or turn red. °· You feel unusually sad or worried. °· You feel nauseous or you vomit. °· You pass large blood clots from your vagina. If you pass a blood clot from your vagina, save it to show to your health care provider. Do not flush blood clots down the toilet without having your health care provider look at them. °· You urinate more than usual. °· You are dizzy or light-headed. °· You have not breastfed at all and you have not had a menstrual period for 12 weeks after delivery. °· You have stopped breastfeeding and you have not had a menstrual period for 12 weeks after you stopped breastfeeding. °Get help right away if: °· You have: °¨ Pain that does not go away or does not get better with medicine. °¨ Chest pain. °¨ Difficulty breathing. °¨ Blurred vision or spots in your vision. °¨ Thoughts about hurting yourself or your baby. °· You develop pain in your abdomen or in one of your legs. °· You develop a severe headache. °· You faint. °· You bleed from your vagina so much that you fill two sanitary pads in one hour. °This information is not intended to replace advice given to you by your health care provider. Make sure you discuss any questions you have with your health care provider. °Document Released: 04/08/2000 Document Revised: 09/23/2015 Document Reviewed: 04/26/2015 °Elsevier Interactive Patient Education © 2017 Elsevier Inc. ° ° °Etonogestrel implant °What is this medicine? °ETONOGESTREL (et oh noe JES trel) is a contraceptive (birth control) device. It is used to prevent pregnancy. It can be used for up to 3  years. °This medicine may be used for other purposes; ask your health care provider or pharmacist if you have questions. °COMMON BRAND NAME(S): Implanon, Nexplanon °What should I tell my health care provider before I take this medicine? °They need to know if you have any of these conditions: °-abnormal vaginal bleeding °-blood vessel disease or blood clots °-cancer of the breast, cervix, or liver °-depression °-diabetes °-gallbladder disease °-headaches °-heart disease or recent heart attack °-high blood pressure °-high cholesterol °-kidney disease °-liver disease °-renal disease °-seizures °-tobacco smoker °-an unusual or allergic reaction to etonogestrel, other hormones, anesthetics or antiseptics, medicines, foods, dyes, or preservatives °-pregnant or trying to get pregnant °-breast-feeding °How should I use this medicine? °This device is inserted just under the skin on the inner side of your upper arm by a health care professional. °Talk to your pediatrician regarding the use of this medicine in children. Special care may be needed. °Overdosage: If   this medicine contact a poison control center or emergency room at once. NOTE: This medicine is only for you. Do not share this medicine with others. What if I miss a dose? This does not apply. What may interact with this medicine? Do not take this medicine with any of the following medications: -amprenavir -bosentan -fosamprenavir This medicine may also interact with the following medications: -barbiturate medicines for inducing sleep or treating seizures -certain medicines for fungal infections like ketoconazole and itraconazole -grapefruit juice -griseofulvin -medicines to treat seizures like carbamazepine, felbamate, oxcarbazepine, phenytoin, topiramate -modafinil -phenylbutazone -rifampin -rufinamide -some medicines to treat HIV infection like atazanavir, indinavir, lopinavir, nelfinavir, tipranavir, ritonavir -St.  John's wort This list may not describe all possible interactions. Give your health care provider a list of all the medicines, herbs, non-prescription drugs, or dietary supplements you use. Also tell them if you smoke, drink alcohol, or use illegal drugs. Some items may interact with your medicine. What should I watch for while using this medicine? This product does not protect you against HIV infection (AIDS) or other sexually transmitted diseases. You should be able to feel the implant by pressing your fingertips over the skin where it was inserted. Contact your doctor if you cannot feel the implant, and use a non-hormonal birth control method (such as condoms) until your doctor confirms that the implant is in place. If you feel that the implant may have broken or become bent while in your arm, contact your healthcare provider. What side effects may I notice from receiving this medicine? Side effects that you should report to your doctor or health care professional as soon as possible: -allergic reactions like skin rash, itching or hives, swelling of the face, lips, or tongue -breast lumps -changes in emotions or moods -depressed mood -heavy or prolonged menstrual bleeding -pain, irritation, swelling, or bruising at the insertion site -scar at site of insertion -signs of infection at the insertion site such as fever, and skin redness, pain or discharge -signs of pregnancy -signs and symptoms of a blood clot such as breathing problems; changes in vision; chest pain; severe, sudden headache; pain, swelling, warmth in the leg; trouble speaking; sudden numbness or weakness of the face, arm or leg -signs and symptoms of liver injury like dark yellow or brown urine; general ill feeling or flu-like symptoms; light-colored stools; loss of appetite; nausea; right upper belly pain; unusually weak or tired; yellowing of the eyes or skin -unusual vaginal bleeding, discharge -signs and symptoms of a stroke  like changes in vision; confusion; trouble speaking or understanding; severe headaches; sudden numbness or weakness of the face, arm or leg; trouble walking; dizziness; loss of balance or coordination Side effects that usually do not require medical attention (report to your doctor or health care professional if they continue or are bothersome): -acne -back pain -breast pain -changes in weight -dizziness -general ill feeling or flu-like symptoms -headache -irregular menstrual bleeding -nausea -sore throat -vaginal irritation or inflammation This list may not describe all possible side effects. Call your doctor for medical advice about side effects. You may report side effects to FDA at 1-800-FDA-1088. Where should I keep my medicine? This drug is given in a hospital or clinic and will not be stored at home. NOTE: This sheet is a summary. It may not cover all possible information. If you have questions about this medicine, talk to your doctor, pharmacist, or health care provider.  2018 Elsevier/Gold Standard (2015-10-29 11:19:22)

## 2016-09-23 NOTE — Discharge Summary (Signed)
OB Discharge Summary     Patient Name: Casey Quinn DOB: [birth year] MRN: [id number]  Date of admission: 09/21/2016 Delivering MD: Mauri ReadingZEITLER, MATTHEW R   Date of discharge: 09/23/2016  Admitting diagnosis: 39 WEEKS CTX Intrauterine pregnancy: 5874w0d     Secondary diagnosis:  Active Problems:   Normal labor   NSVD (normal spontaneous vaginal delivery)  Additional problems: None     Discharge diagnosis: Term Pregnancy Delivered                                                                                                Post partum procedures:None  Augmentation: Pitocin  Complications: None  Hospital course:  Onset of Labor With Vaginal Delivery     26 y.o. yo G5P5005 at 3374w0d was admitted in Active Labor on 09/21/2016. Patient had an uncomplicated labor course as follows:  Membrane Rupture Time/Date: 10:45 AM ,09/21/2016   Intrapartum Procedures: Episiotomy: None [1]                                         Lacerations:  None [1]  Patient had a delivery of a Viable infant. 09/21/2016  Information for the patient's newborn:  Raylene MiyamotoMars, Boy Bri [[id number]][[id number]]  Delivery Method: Vag-Spont    Pateint had an uncomplicated postpartum course.  She is ambulating, tolerating a regular diet, passing flatus, and urinating well. Patient is discharged home in stable condition on 09/23/16.   Physical exam  Vitals:   09/21/16 1911 09/22/16 0325 09/22/16 0552 09/23/16 0500  BP: 116/63 107/65 (!) 105/56 116/64  Pulse: 87 70 68 70  Resp: 18 16 16 18   Temp: 98.8 F (37.1 C) 97.7 F (36.5 C) 97.8 F (36.6 C) 98.1 F (36.7 C)  TempSrc: Oral Oral Oral Oral  SpO2: 99%     Weight:      Height:       General: alert, cooperative and no distress Lochia: appropriate Uterine Fundus: firm Incision: N/A DVT Evaluation: No evidence of DVT seen on physical exam. Negative Homan's sign. No cords or calf tenderness. Labs: Lab Results  Component Value Date   WBC 10.5 09/21/2016   HGB 9.5 (L) 09/21/2016    HCT 29.1 (L) 09/21/2016   MCV 69.6 (L) 09/21/2016   PLT 188 09/21/2016   No flowsheet data found.  Discharge instruction: per After Visit Summary and "Baby and Me Booklet".  After visit meds:  Allergies as of 09/23/2016   No Known Allergies     Medication List    STOP taking these medications   ondansetron 4 MG disintegrating tablet Commonly known as:  ZOFRAN ODT     TAKE these medications   ibuprofen 600 MG tablet Commonly known as:  ADVIL,MOTRIN Take 1 tablet (600 mg total) by mouth every 6 (six) hours.   multivitamin-prenatal 27-0.8 MG Tabs tablet Take 1 tablet by mouth daily at 12 noon.       Diet: routine diet  Activity: Advance as tolerated. Pelvic rest for 6 weeks.   Outpatient  follow up:6 weeks Follow up Appt:Future Appointments Date Time Provider Department Center  11/03/2016 1:20 PM Leftwich-Kirby, Wilmer Floor, CNM [facility]   Follow up Visit:No Follow-up on file.  Postpartum contraception: Nexplanon  Newborn Data: Live born female  Birth Weight: 6 lb 15.1 oz (3150 g) APGAR: 9, 9  Baby Feeding: Breast Disposition:home with mother   09/23/2016 Jen Mow, DO OB Fellow

## 2016-09-23 NOTE — Progress Notes (Signed)
Post Partum Day 2  Subjective:  Casey Quinn is a 26 y.o. [id number] 2963w0d s/p SVD.  No acute events overnight.  Pt denies problems with ambulating, voiding or po intake.  She denies nausea or vomiting.  Pain is well controlled.  She has had flatus. She has had bowel movement.  Lochia minimal.  Plan for birth control is currently undecided.  Method of Feeding: breast.   Objective: BP 116/64 (BP Location: Left Arm)   Pulse 70   Temp 98.1 F (36.7 C) (Oral)   Resp 18   Ht 5\' 3"  (1.6 m)   Wt 86.6 kg (191 lb)   LMP 12/16/2015   SpO2 99%   Breastfeeding? Unknown   BMI 33.83 kg/m   Physical Exam:  General: alert, cooperative and no distress Lochia:normal flow Chest: CTAB Heart: RRR no m/r/g; normal S1, S2 Abdomen: +BS, soft, nontender, fundus firm below umbilicus DVT Evaluation: no evidence of DVT seen on physical exam. Extremities: no edema noted   Recent Labs  09/21/16 0235  HGB 9.5*  HCT 29.1*    Assessment/Plan:  ASSESSMENT: Casey Quinn is a 26 y.o. [id number] 8063w0d ppd #2 s/p NSVD, doing well.   Discharge home today.   LOS: 2 days   Thurnell LoseAlessandra G Tomasi medical student 09/23/2016, 7:26 AM   I have seen and examined this patient and agree with the management plan.  I have written a separate note

## 2016-09-25 LAB — TYPE AND SCREEN
ABO/RH(D): A POS
Antibody Screen: POSITIVE
DAT, IGG: NEGATIVE
UNIT DIVISION: 0
UNIT DIVISION: 0

## 2016-09-25 LAB — BPAM RBC
BLOOD PRODUCT EXPIRATION DATE: 201806262359
Blood Product Expiration Date: 201806252359
UNIT TYPE AND RH: 5100
Unit Type and Rh: 5100

## 2016-11-03 ENCOUNTER — Ambulatory Visit (INDEPENDENT_AMBULATORY_CARE_PROVIDER_SITE_OTHER): Payer: Medicaid Other | Admitting: Advanced Practice Midwife

## 2016-11-03 ENCOUNTER — Encounter: Payer: Self-pay | Admitting: Advanced Practice Midwife

## 2016-11-03 DIAGNOSIS — Z308 Encounter for other contraceptive management: Secondary | ICD-10-CM

## 2016-11-03 MED ORDER — NORETHINDRONE 0.35 MG PO TABS: 1.0000 | ORAL_TABLET | Freq: Every day | ORAL | 11 refills | Status: DC

## 2016-11-03 NOTE — Progress Notes (Signed)
Subjective:     Casey Quinn is a 26 y.o. female who presents for a postpartum visit. She is 6 weeks postpartum following a spontaneous vaginal delivery. I have fully reviewed the prenatal and intrapartum course. The delivery was at 40 gestational weeks. Outcome: spontaneous vaginal delivery. Anesthesia: epidural. Postpartum course has been normal. Baby's course has been normal. Baby is feeding by breast. Bleeding no bleeding. Bowel function is normal. Bladder function is normal. Patient is sexually active. Contraception method is oral progesterone-only contraceptive. Postpartum depression screening: negative.  The following portions of the patient's history were reviewed and updated as appropriate: allergies, current medications, past family history, past medical history, past social history, past surgical history and problem list.  Review of Systems A comprehensive review of systems was negative.   Objective:    BP 116/72   Pulse 92   Wt 178 lb 11.2 oz (81.1 kg)   Breastfeeding? Yes   BMI 31.66 kg/m   VS reviewed, nursing note reviewed,  Constitutional: well developed, well nourished, no distress HEENT: normocephalic CV: normal rate Pulm/chest wall: normal effort Abdomen: soft Neuro: alert and oriented x 3 Skin: warm, dry Psych: affect normal  Pregnancy test was collected and was reported as negative by RN  Assessment:      1. Postpartum care following vaginal delivery   2. Encounter for other contraceptive management     Plan:    1. Contraception: oral progesterone-only contraceptive. Rx for Micronor today, [facility]. 2. Follow up in: 1 year or as needed.

## 2016-12-25 ENCOUNTER — Encounter (HOSPITAL_COMMUNITY): Payer: Self-pay

## 2016-12-25 ENCOUNTER — Inpatient Hospital Stay (HOSPITAL_COMMUNITY)
Admission: AD | Admit: 2016-12-25 | Discharge: 2016-12-25 | Disposition: A | Discharge status: Home / Self Care | Payer: Medicaid Other | Source: Ambulatory Visit | Attending: Obstetrics & Gynecology | Admitting: Obstetrics & Gynecology

## 2016-12-25 DIAGNOSIS — Z87891 Personal history of nicotine dependence: Secondary | ICD-10-CM | POA: Insufficient documentation

## 2016-12-25 DIAGNOSIS — L298 Other pruritus: Secondary | ICD-10-CM | POA: Diagnosis present

## 2016-12-25 DIAGNOSIS — R51 Headache: Secondary | ICD-10-CM | POA: Diagnosis not present

## 2016-12-25 DIAGNOSIS — N949 Unspecified condition associated with female genital organs and menstrual cycle: Secondary | ICD-10-CM | POA: Diagnosis present

## 2016-12-25 LAB — URINALYSIS, ROUTINE W REFLEX MICROSCOPIC
BILIRUBIN URINE: NEGATIVE
GLUCOSE, UA: NEGATIVE mg/dL
Hgb urine dipstick: NEGATIVE
Ketones, ur: NEGATIVE mg/dL
Leukocytes, UA: NEGATIVE
Nitrite: NEGATIVE
Protein, ur: NEGATIVE mg/dL
SPECIFIC GRAVITY, URINE: 1.024 (ref 1.005–1.030)
pH: 5 (ref 5.0–8.0)

## 2016-12-25 LAB — WET PREP, GENITAL
Sperm: NONE SEEN
TRICH WET PREP: NONE SEEN
YEAST WET PREP: NONE SEEN

## 2016-12-25 LAB — POCT PREGNANCY, URINE: Preg Test, Ur: NEGATIVE

## 2016-12-25 NOTE — Discharge Instructions (Signed)

## 2016-12-25 NOTE — MAU Note (Signed)
Pt states she took a shower about 45 mins ago and immediately started having vaginal burning and some itching. Pt states she has some vaginal discharge, but looks normal. Pt states she used the same soap she normally uses. Pt has not changed detergents. Pt denies vaginal bleeding. LMP: some time in July.

## 2016-12-25 NOTE — MAU Provider Note (Signed)
History     CSN: [id number]  Arrival date and time: 12/25/16 1905   None     Chief Complaint  Patient presents with  . Vaginal Itching   Non-pregnant female here with vaginal burning x1 hour. Describes as burning on outside of vagina near clitoris, started after a shower. She used antibacterial soap but is not a new product for her. No abnormal vaginal discharge. No new partner but would like STD screening. Has not used anything for the discomfort. Burning has subsided since arrival to MAU. Is not using contraception but would like to.     Past Medical History:  Diagnosis Date  . Gastroenteritis 09/14/2016  . Headache     Past Surgical History:  Procedure Laterality Date  . NO PAST SURGERIES      Family History  Problem Relation Age of Onset  . Cancer Sister     Social History  Substance Use Topics  . Smoking status: Former Games developer  . Smokeless tobacco: Never Used  . Alcohol use No    Allergies: No Known Allergies  Prescriptions Prior to Admission  Medication Sig Dispense Refill Last Dose  . ibuprofen (ADVIL,MOTRIN) 600 MG tablet Take 1 tablet (600 mg total) by mouth every 6 (six) hours. 30 tablet 0   . norethindrone (MICRONOR,CAMILA,ERRIN) 0.35 MG tablet Take 1 tablet (0.35 mg total) by mouth daily. 1 Package 11   . Prenatal Vit-Fe Fumarate-FA (MULTIVITAMIN-PRENATAL) 27-0.8 MG TABS tablet Take 1 tablet by mouth daily at 12 noon.   Past Month at Unknown time    Review of Systems  Constitutional: Negative.   Genitourinary: Positive for vaginal pain. Negative for vaginal discharge.   Physical Exam   Blood pressure 123/70, pulse 88, temperature 98.5 F (36.9 C), temperature source Oral, resp. rate 17, height 5\' 3"  (1.6 m), weight 188 lb (85.3 kg), SpO2 99 %, currently breastfeeding.  Physical Exam  Nursing note and vitals reviewed. Constitutional: She is oriented to person, place, and time. She appears well-developed and well-nourished. No distress.  HENT:   Head: Normocephalic and atraumatic.  Neck: Normal range of motion.  Cardiovascular: Normal rate.   Respiratory: Effort normal.  Genitourinary: Vagina normal. There is no rash, tenderness, lesion or injury on the right labia. There is no rash, tenderness, lesion or injury on the left labia. No erythema in the vagina. No signs of injury around the vagina. No vaginal discharge found.  Musculoskeletal: Normal range of motion.  Neurological: She is alert and oriented to person, place, and time.  Skin: Skin is warm and dry.  Psychiatric: She has a normal mood and affect.   Results for orders placed or performed during the hospital encounter of 12/25/16 (from the past 24 hour(s))  Urinalysis, Routine w reflex microscopic     Status: None   Collection Time: 12/25/16  7:14 PM  Result Value Ref Range   Color, Urine YELLOW YELLOW   APPearance CLEAR CLEAR   Specific Gravity, Urine 1.024 1.005 - 1.030   pH 5.0 5.0 - 8.0   Glucose, UA NEGATIVE NEGATIVE mg/dL   Hgb urine dipstick NEGATIVE NEGATIVE   Bilirubin Urine NEGATIVE NEGATIVE   Ketones, ur NEGATIVE NEGATIVE mg/dL   Protein, ur NEGATIVE NEGATIVE mg/dL   Nitrite NEGATIVE NEGATIVE   Leukocytes, UA NEGATIVE NEGATIVE  Pregnancy, urine POC     Status: None   Collection Time: 12/25/16  7:28 PM  Result Value Ref Range   Preg Test, Ur NEGATIVE NEGATIVE  Wet prep, genital  Status: Abnormal   Collection Time: 12/25/16  8:16 PM  Result Value Ref Range   Yeast Wet Prep HPF POC NONE SEEN NONE SEEN   Trich, Wet Prep NONE SEEN NONE SEEN   Clue Cells Wet Prep HPF POC PRESENT (A) NONE SEEN   WBC, Wet Prep HPF POC MODERATE (A) NONE SEEN   Sperm NONE SEEN     MAU Course  Procedures  MDM Labs ordered and reviewed. Sx likely d/t soap. Recommend tub soaks with baking soda for the next few days. Discontinue antibacterial soap, use mild non-fragrance soaps only. Stable for discharge home.  Assessment and Plan   1. Vaginal burning    Discharge  home Follow up in [facility] asap (desires contraception change) Return to MAU for OBGYN emergencies  Allergies as of 12/25/2016   No Known Allergies     Medication List    TAKE these medications   ibuprofen 600 MG tablet Commonly known as:  ADVIL,MOTRIN Take 1 tablet (600 mg total) by mouth every 6 (six) hours.   multivitamin-prenatal 27-0.8 MG Tabs tablet Take 1 tablet by mouth daily at 12 noon.   norethindrone 0.35 MG tablet Commonly known as:  MICRONOR,CAMILA,ERRIN Take 1 tablet (0.35 mg total) by mouth daily.            Discharge Care Instructions        Start     Ordered   12/25/16 0000  Discharge patient    Question Answer Comment  Discharge disposition 01-Home or Self Care   Discharge patient date 12/25/2016      12/25/16 2031     Donette LarryMelanie Rhapsody Wolven, CNM 12/25/2016, 8:32 PM

## 2016-12-29 LAB — GC/CHLAMYDIA PROBE AMP (~~LOC~~) NOT AT ARMC
CHLAMYDIA, DNA PROBE: NEGATIVE
NEISSERIA GONORRHEA: NEGATIVE

## 2017-01-17 ENCOUNTER — Ambulatory Visit (INDEPENDENT_AMBULATORY_CARE_PROVIDER_SITE_OTHER): Payer: Medicaid Other | Admitting: Medical

## 2017-01-17 ENCOUNTER — Encounter: Payer: Self-pay | Admitting: Medical

## 2017-01-17 VITALS — BP 127/69 | HR 74 | Ht 63.0 in | Wt 191.6 lb

## 2017-01-17 DIAGNOSIS — Z30017 Encounter for initial prescription of implantable subdermal contraceptive: Secondary | ICD-10-CM

## 2017-01-17 DIAGNOSIS — Z3049 Encounter for surveillance of other contraceptives: Secondary | ICD-10-CM

## 2017-01-17 DIAGNOSIS — Z029 Encounter for administrative examinations, unspecified: Secondary | ICD-10-CM

## 2017-01-17 LAB — POCT PREGNANCY, URINE: Preg Test, Ur: NEGATIVE

## 2017-01-17 MED ORDER — ETONOGESTREL 68 MG ~~LOC~~ IMPL
68.0000 mg | DRUG_IMPLANT | Freq: Once | SUBCUTANEOUS | Status: AC
  Administered 2017-01-17: 68 mg via SUBCUTANEOUS

## 2017-01-17 NOTE — Patient Instructions (Signed)
Etonogestrel implant What is this medicine? ETONOGESTREL (et oh noe JES trel) is a contraceptive (birth control) device. It is used to prevent pregnancy. It can be used for up to 3 years. This medicine may be used for other purposes; ask your health care provider or pharmacist if you have questions. COMMON BRAND NAME(S): Implanon, Nexplanon What should I tell my health care provider before I take this medicine? They need to know if you have any of these conditions: -abnormal vaginal bleeding -blood vessel disease or blood clots -cancer of the breast, cervix, or liver -depression -diabetes -gallbladder disease -headaches -heart disease or recent heart attack -high blood pressure -high cholesterol -kidney disease -liver disease -renal disease -seizures -tobacco smoker -an unusual or allergic reaction to etonogestrel, other hormones, anesthetics or antiseptics, medicines, foods, dyes, or preservatives -pregnant or trying to get pregnant -breast-feeding How should I use this medicine? This device is inserted just under the skin on the inner side of your upper arm by a health care professional. Talk to your pediatrician regarding the use of this medicine in children. Special care may be needed. Overdosage: If you think you have taken too much of this medicine contact a poison control center or emergency room at once. NOTE: This medicine is only for you. Do not share this medicine with others. What if I miss a dose? This does not apply. What may interact with this medicine? Do not take this medicine with any of the following medications: -amprenavir -bosentan -fosamprenavir This medicine may also interact with the following medications: -barbiturate medicines for inducing sleep or treating seizures -certain medicines for fungal infections like ketoconazole and itraconazole -grapefruit juice -griseofulvin -medicines to treat seizures like carbamazepine, felbamate, oxcarbazepine,  phenytoin, topiramate -modafinil -phenylbutazone -rifampin -rufinamide -some medicines to treat HIV infection like atazanavir, indinavir, lopinavir, nelfinavir, tipranavir, ritonavir -St. John's wort This list may not describe all possible interactions. Give your health care provider a list of all the medicines, herbs, non-prescription drugs, or dietary supplements you use. Also tell them if you smoke, drink alcohol, or use illegal drugs. Some items may interact with your medicine. What should I watch for while using this medicine? This product does not protect you against HIV infection (AIDS) or other sexually transmitted diseases. You should be able to feel the implant by pressing your fingertips over the skin where it was inserted. Contact your doctor if you cannot feel the implant, and use a non-hormonal birth control method (such as condoms) until your doctor confirms that the implant is in place. If you feel that the implant may have broken or become bent while in your arm, contact your healthcare provider. What side effects may I notice from receiving this medicine? Side effects that you should report to your doctor or health care professional as soon as possible: -allergic reactions like skin rash, itching or hives, swelling of the face, lips, or tongue -breast lumps -changes in emotions or moods -depressed mood -heavy or prolonged menstrual bleeding -pain, irritation, swelling, or bruising at the insertion site -scar at site of insertion -signs of infection at the insertion site such as fever, and skin redness, pain or discharge -signs of pregnancy -signs and symptoms of a blood clot such as breathing problems; changes in vision; chest pain; severe, sudden headache; pain, swelling, warmth in the leg; trouble speaking; sudden numbness or weakness of the face, arm or leg -signs and symptoms of liver injury like dark yellow or brown urine; general ill feeling or flu-like symptoms;  light-colored   stools; loss of appetite; nausea; right upper belly pain; unusually weak or tired; yellowing of the eyes or skin -unusual vaginal bleeding, discharge -signs and symptoms of a stroke like changes in vision; confusion; trouble speaking or understanding; severe headaches; sudden numbness or weakness of the face, arm or leg; trouble walking; dizziness; loss of balance or coordination Side effects that usually do not require medical attention (report to your doctor or health care professional if they continue or are bothersome): -acne -back pain -breast pain -changes in weight -dizziness -general ill feeling or flu-like symptoms -headache -irregular menstrual bleeding -nausea -sore throat -vaginal irritation or inflammation This list may not describe all possible side effects. Call your doctor for medical advice about side effects. You may report side effects to FDA at 1-800-FDA-1088. Where should I keep my medicine? This drug is given in a hospital or clinic and will not be stored at home. NOTE: This sheet is a summary. It may not cover all possible information. If you have questions about this medicine, talk to your doctor, pharmacist, or health care provider.  2018 Elsevier/Gold Standard (2015-10-29 11:19:22)  

## 2017-01-17 NOTE — Progress Notes (Signed)
GYNECOLOGY CLINIC PROCEDURE NOTE  Ms. Samanth Mirkin is a 26 y.o. [id number] here for Nexplanon insertion. No GYN concerns.  Last pap smear was on 03/23/16 and was normal.  No other gynecologic concerns.  Nexplanon Insertion Procedure Patient was given informed consent, she signed consent form.  Patient does understand that irregular bleeding is a very common side effect of this medication. She was advised to have backup contraception for one week after placement. Pregnancy test in clinic today was negative.  Appropriate time out taken.  Patient's left arm was prepped and draped in the usual sterile fashion.. The ruler used to measure and mark insertion area.  Patient was prepped with alcohol swab and then injected with 3 ml of 1% lidocaine.  She was prepped with betadine, Nexplanon removed from packaging,  Device confirmed in needle, then inserted full length of needle and withdrawn per handbook instructions. Nexplanon was able to palpated in the patient's arm; patient palpated the insert herself. There was minimal blood loss.  Patient insertion site covered with guaze and a pressure bandage to reduce any bruising.  The patient tolerated the procedure well and was given post procedure instructions.   Marny Lowenstein, PA-C 01/17/2017 11:20 AM

## 2017-06-06 ENCOUNTER — Ambulatory Visit: Payer: Medicaid Other | Admitting: Family Medicine

## 2017-08-02 ENCOUNTER — Inpatient Hospital Stay (HOSPITAL_COMMUNITY)
Admission: AD | Admit: 2017-08-02 | Discharge: 2017-08-02 | Disposition: A | Discharge status: Home / Self Care | Payer: Medicaid Other | Source: Ambulatory Visit | Attending: Obstetrics & Gynecology | Admitting: Obstetrics & Gynecology

## 2017-08-02 ENCOUNTER — Encounter (HOSPITAL_COMMUNITY): Payer: Self-pay

## 2017-08-02 DIAGNOSIS — R109 Unspecified abdominal pain: Secondary | ICD-10-CM | POA: Insufficient documentation

## 2017-08-02 DIAGNOSIS — A5901 Trichomonal vulvovaginitis: Secondary | ICD-10-CM | POA: Insufficient documentation

## 2017-08-02 DIAGNOSIS — Z113 Encounter for screening for infections with a predominantly sexual mode of transmission: Secondary | ICD-10-CM | POA: Insufficient documentation

## 2017-08-02 DIAGNOSIS — Z3202 Encounter for pregnancy test, result negative: Secondary | ICD-10-CM | POA: Insufficient documentation

## 2017-08-02 DIAGNOSIS — A599 Trichomoniasis, unspecified: Secondary | ICD-10-CM

## 2017-08-02 DIAGNOSIS — Z87891 Personal history of nicotine dependence: Secondary | ICD-10-CM | POA: Diagnosis not present

## 2017-08-02 DIAGNOSIS — N898 Other specified noninflammatory disorders of vagina: Secondary | ICD-10-CM | POA: Diagnosis present

## 2017-08-02 LAB — URINALYSIS, ROUTINE W REFLEX MICROSCOPIC
BILIRUBIN URINE: NEGATIVE
Glucose, UA: NEGATIVE mg/dL
Hgb urine dipstick: NEGATIVE
KETONES UR: NEGATIVE mg/dL
LEUKOCYTES UA: NEGATIVE
NITRITE: NEGATIVE
PROTEIN: NEGATIVE mg/dL
Specific Gravity, Urine: 1.025 (ref 1.005–1.030)
pH: 5 (ref 5.0–8.0)

## 2017-08-02 LAB — WET PREP, GENITAL
CLUE CELLS WET PREP: NONE SEEN
Sperm: NONE SEEN
Yeast Wet Prep HPF POC: NONE SEEN

## 2017-08-02 LAB — POCT PREGNANCY, URINE: Preg Test, Ur: NEGATIVE

## 2017-08-02 MED ORDER — METRONIDAZOLE 500 MG PO TABS
2000.0000 mg | ORAL_TABLET | Freq: Once | ORAL | Status: AC
  Administered 2017-08-02: 2000 mg via ORAL
  Filled 2017-08-02: qty 4

## 2017-08-02 NOTE — MAU Note (Signed)
Pt reports brown discharge for several weeks and abdominal cramping that started when she had her Nexplanon placed in September. Pt also reports HA.

## 2017-08-02 NOTE — MAU Provider Note (Signed)
History     CSN: [id number]  Arrival date and time: 08/02/17 1950   First Provider Initiated Contact with Patient 08/02/17 2047      Chief Complaint  Patient presents with  . Vaginal Discharge  . Abdominal Pain   HPI  Casey Quinn is a 27 y.o. [id number] non pregnant female who presents with abdominal cramping and vaginal discharge. Had Nexplanon placed in September 2018. Reports frequent lower abdominal cramping since nexplanon was placed. Currently denies pain. Presents today because now the pain is associated with a new discharge. Normally has a tan/brown discharge. Today the discharge went from clear, to red streaks, to now clear again. No odor, itching, irritation, or lesions. Denies fever/chills, or dysuria. She is sexually active with 1 partner x 3 years and does not use condoms.   Past Medical History:  Diagnosis Date  . Gastroenteritis 09/14/2016  . Headache     Past Surgical History:  Procedure Laterality Date  . NO PAST SURGERIES      Family History  Problem Relation Age of Onset  . Cancer Sister   . Cancer Maternal Uncle     Social History   Tobacco Use  . Smoking status: Former Games developer  . Smokeless tobacco: Never Used  Substance Use Topics  . Alcohol use: No  . Drug use: No    Allergies: No Known Allergies  Medications Prior to Admission  Medication Sig Dispense Refill Last Dose  . ibuprofen (ADVIL,MOTRIN) 600 MG tablet Take 1 tablet (600 mg total) by mouth every 6 (six) hours. (Patient not taking: Reported on 01/17/2017) 30 tablet 0 Not Taking  . norethindrone (MICRONOR,CAMILA,ERRIN) 0.35 MG tablet Take 1 tablet (0.35 mg total) by mouth daily. (Patient not taking: Reported on 01/17/2017) 1 Package 11 Not Taking  . Prenatal Vit-Fe Fumarate-FA (MULTIVITAMIN-PRENATAL) 27-0.8 MG TABS tablet Take 1 tablet by mouth daily at 12 noon.   Not Taking    Review of Systems  Constitutional: Negative.   Gastrointestinal: Negative.   Genitourinary: Positive for vaginal  discharge. Negative for dysuria, genital sores, vaginal bleeding and vaginal pain.   Physical Exam   Blood pressure (!) 141/74, pulse 83, temperature 98.8 F (37.1 C), temperature source Oral, resp. rate 20, height 5\' 3"  (1.6 m), weight 195 lb (88.5 kg), SpO2 98 %, currently breastfeeding.  Physical Exam  Nursing note and vitals reviewed. Constitutional: She is oriented to person, place, and time. She appears well-developed and well-nourished. No distress.  HENT:  Head: Normocephalic and atraumatic.  Eyes: Conjunctivae are normal. Right eye exhibits no discharge. Left eye exhibits no discharge. No scleral icterus.  Neck: Normal range of motion.  Respiratory: Effort normal. No respiratory distress.  GI: Soft. There is no tenderness.  Neurological: She is alert and oriented to person, place, and time.  Skin: Skin is warm and dry. She is not diaphoretic.  Psychiatric: She has a normal mood and affect. Her behavior is normal. Judgment and thought content normal.    MAU Course  Procedures Results for orders placed or performed during the hospital encounter of 08/02/17 (from the past 24 hour(s))  Urinalysis, Routine w reflex microscopic     Status: Abnormal   Collection Time: 08/02/17  8:07 PM  Result Value Ref Range   Color, Urine YELLOW YELLOW   APPearance HAZY (A) CLEAR   Specific Gravity, Urine 1.025 1.005 - 1.030   pH 5.0 5.0 - 8.0   Glucose, UA NEGATIVE NEGATIVE mg/dL   Hgb urine dipstick NEGATIVE NEGATIVE  Bilirubin Urine NEGATIVE NEGATIVE   Ketones, ur NEGATIVE NEGATIVE mg/dL   Protein, ur NEGATIVE NEGATIVE mg/dL   Nitrite NEGATIVE NEGATIVE   Leukocytes, UA NEGATIVE NEGATIVE  Wet prep, genital     Status: Abnormal   Collection Time: 08/02/17  8:26 PM  Result Value Ref Range   Yeast Wet Prep HPF POC NONE SEEN NONE SEEN   Trich, Wet Prep PRESENT (A) NONE SEEN   Clue Cells Wet Prep HPF POC NONE SEEN NONE SEEN   WBC, Wet Prep HPF POC MODERATE (A) NONE SEEN   Sperm NONE  SEEN   Pregnancy, urine POC     Status: None   Collection Time: 08/02/17  8:35 PM  Result Value Ref Range   Preg Test, Ur NEGATIVE NEGATIVE    MDM UPT negative Wet prep + trichomonas. Discussed results with patient. She would like blood work for STI testing.  HIV, RPR, HepC, GC/CT pending Flagyl 2 gm PO for trich tx  Assessment and Plan  A: 1. Trichomonas infection   2. Screen for STD (sexually transmitted disease)   3. Pregnancy examination or test, negative result     P: Discharge home No intercourse x 2 weeks after both have received tx EPT rx & info sheet given STI labs pending  Judeth Hornrin Bobbie Valletta 08/02/2017, 8:47 PM

## 2017-08-02 NOTE — Discharge Instructions (Signed)

## 2017-08-03 LAB — GC/CHLAMYDIA PROBE AMP (~~LOC~~) NOT AT ARMC
Chlamydia: NEGATIVE
Neisseria Gonorrhea: NEGATIVE

## 2017-08-03 LAB — HIV ANTIBODY (ROUTINE TESTING W REFLEX): HIV SCREEN 4TH GENERATION: NONREACTIVE

## 2017-08-03 LAB — RPR: RPR Ser Ql: NONREACTIVE

## 2017-08-04 LAB — HEPATITIS C ANTIBODY: HCV Ab: <0.1 {s_co_ratio} (ref 0.0–0.9)

## 2017-08-22 ENCOUNTER — Encounter: Payer: Self-pay | Admitting: *Deleted

## 2018-01-11 ENCOUNTER — Encounter: Payer: Self-pay | Admitting: Medical

## 2018-01-11 ENCOUNTER — Ambulatory Visit: Payer: Medicaid Other | Admitting: Medical

## 2018-12-03 ENCOUNTER — Other Ambulatory Visit: Payer: Self-pay

## 2018-12-03 ENCOUNTER — Ambulatory Visit (INDEPENDENT_AMBULATORY_CARE_PROVIDER_SITE_OTHER): Payer: Medicaid Other | Admitting: Obstetrics & Gynecology

## 2018-12-03 ENCOUNTER — Encounter: Payer: Self-pay | Admitting: Obstetrics & Gynecology

## 2018-12-03 VITALS — BP 110/67 | HR 100 | Ht 62.0 in | Wt 197.7 lb

## 2018-12-03 DIAGNOSIS — Z3046 Encounter for surveillance of implantable subdermal contraceptive: Secondary | ICD-10-CM | POA: Diagnosis present

## 2018-12-03 NOTE — Patient Instructions (Signed)
Etonogestrel implant What is this medicine? ETONOGESTREL (et oh noe JES trel) is a contraceptive (birth control) device. It is used to prevent pregnancy. It can be used for up to 3 years. This medicine may be used for other purposes; ask your health care provider or pharmacist if you have questions. COMMON BRAND NAME(S): Implanon, Nexplanon What should I tell my health care provider before I take this medicine? They need to know if you have any of these conditions:  abnormal vaginal bleeding  blood vessel disease or blood clots  breast, cervical, endometrial, ovarian, liver, or uterine cancer  diabetes  gallbladder disease  heart disease or recent heart attack  high blood pressure  high cholesterol or triglycerides  kidney disease  liver disease  migraine headaches  seizures  stroke  tobacco smoker  an unusual or allergic reaction to etonogestrel, anesthetics or antiseptics, other medicines, foods, dyes, or preservatives  pregnant or trying to get pregnant  breast-feeding How should I use this medicine? This device is inserted just under the skin on the inner side of your upper arm by a health care professional. Talk to your pediatrician regarding the use of this medicine in children. Special care may be needed. Overdosage: If you think you have taken too much of this medicine contact a poison control center or emergency room at once. NOTE: This medicine is only for you. Do not share this medicine with others. What if I miss a dose? This does not apply. What may interact with this medicine? Do not take this medicine with any of the following medications:  amprenavir  fosamprenavir This medicine may also interact with the following medications:  acitretin  aprepitant  armodafinil  bexarotene  bosentan  carbamazepine  certain medicines for fungal infections like fluconazole, ketoconazole, itraconazole and voriconazole  certain medicines to treat  hepatitis, HIV or AIDS  cyclosporine  felbamate  griseofulvin  lamotrigine  modafinil  oxcarbazepine  phenobarbital  phenytoin  primidone  rifabutin  rifampin  rifapentine  St. John's wort  topiramate This list may not describe all possible interactions. Give your health care provider a list of all the medicines, herbs, non-prescription drugs, or dietary supplements you use. Also tell them if you smoke, drink alcohol, or use illegal drugs. Some items may interact with your medicine. What should I watch for while using this medicine? This product does not protect you against HIV infection (AIDS) or other sexually transmitted diseases. You should be able to feel the implant by pressing your fingertips over the skin where it was inserted. Contact your doctor if you cannot feel the implant, and use a non-hormonal birth control method (such as condoms) until your doctor confirms that the implant is in place. Contact your doctor if you think that the implant may have broken or become bent while in your arm. You will receive a user card from your health care provider after the implant is inserted. The card is a record of the location of the implant in your upper arm and when it should be removed. Keep this card with your health records. What side effects may I notice from receiving this medicine? Side effects that you should report to your doctor or health care professional as soon as possible:  allergic reactions like skin rash, itching or hives, swelling of the face, lips, or tongue  breast lumps, breast tissue changes, or discharge  breathing problems  changes in emotions or moods  if you feel that the implant may have broken or   bent while in your arm  high blood pressure  pain, irritation, swelling, or bruising at the insertion site  scar at site of insertion  signs of infection at the insertion site such as fever, and skin redness, pain or discharge  signs and  symptoms of a blood clot such as breathing problems; changes in vision; chest pain; severe, sudden headache; pain, swelling, warmth in the leg; trouble speaking; sudden numbness or weakness of the face, arm or leg  signs and symptoms of liver injury like dark yellow or brown urine; general ill feeling or flu-like symptoms; light-colored stools; loss of appetite; nausea; right upper belly pain; unusually weak or tired; yellowing of the eyes or skin  unusual vaginal bleeding, discharge Side effects that usually do not require medical attention (report to your doctor or health care professional if they continue or are bothersome):  acne  breast pain or tenderness  headache  irregular menstrual bleeding  nausea This list may not describe all possible side effects. Call your doctor for medical advice about side effects. You may report side effects to FDA at 1-800-FDA-1088. Where should I keep my medicine? This drug is given in a hospital or clinic and will not be stored at home. NOTE: This sheet is a summary. It may not cover all possible information. If you have questions about this medicine, talk to your doctor, pharmacist, or health care provider.  2020 Elsevier/Gold Standard (2017-02-28 14:11:42)  

## 2018-12-03 NOTE — Progress Notes (Signed)
GYNECOLOGY OFFICE PROCEDURE NOTE  Casey Quinn is a 28 y.o. [id number] here for Nexplanon removal .  Last pap smear was on 02/2016 and was normal.  No other gynecologic concerns. She had headaches and weight gain    Nexplanon Removal Patient identified, informed consent performed, consent signed.   Appropriate time out taken. Nexplanon site identified.  Area prepped in usual sterile fashon. 4 ml total of 1% lidocaine was used to anesthetize the area at the distal end of the implant. A small stab incision was made right beside the implant on the distal portion.  The Nexplanon rod was grasped using hemostats and removed without difficulty.  There was minimal blood loss. There were no complications. Steri-strips were applied over the small incision.  A pressure bandage was applied to reduce any bruising.  The patient tolerated the procedure well and was given post procedure instructions.  Patient is planning to use abstinence for contraception      Woodroe Mode, MD Attending Obstetrician & Gynecologist, [facility]  12/03/2018

## 2018-12-04 ENCOUNTER — Encounter: Payer: Self-pay | Admitting: *Deleted

## 2019-03-27 ENCOUNTER — Ambulatory Visit: Payer: Medicaid Other | Admitting: Obstetrics & Gynecology

## 2019-04-16 ENCOUNTER — Encounter

## 2019-04-17 ENCOUNTER — Encounter: Payer: Self-pay | Admitting: Family Medicine

## 2019-04-17 ENCOUNTER — Ambulatory Visit: Payer: Medicaid Other | Admitting: Family Medicine

## 2019-04-17 NOTE — Progress Notes (Signed)
Patient did not keep appointment today. She may call to reschedule.  

## 2019-10-16 ENCOUNTER — Other Ambulatory Visit (HOSPITAL_COMMUNITY)
Admission: RE | Admit: 2019-10-16 | Discharge: 2019-10-16 | Disposition: A | Discharge status: Home / Self Care | Payer: Medicaid Other | Source: Ambulatory Visit | Attending: Obstetrics and Gynecology | Admitting: Obstetrics and Gynecology

## 2019-10-16 ENCOUNTER — Ambulatory Visit (INDEPENDENT_AMBULATORY_CARE_PROVIDER_SITE_OTHER): Payer: Medicaid Other | Admitting: General Practice

## 2019-10-16 ENCOUNTER — Other Ambulatory Visit: Payer: Self-pay

## 2019-10-16 DIAGNOSIS — Z3202 Encounter for pregnancy test, result negative: Secondary | ICD-10-CM | POA: Diagnosis not present

## 2019-10-16 DIAGNOSIS — N949 Unspecified condition associated with female genital organs and menstrual cycle: Secondary | ICD-10-CM | POA: Insufficient documentation

## 2019-10-16 DIAGNOSIS — Z113 Encounter for screening for infections with a predominantly sexual mode of transmission: Secondary | ICD-10-CM | POA: Insufficient documentation

## 2019-10-16 DIAGNOSIS — N926 Irregular menstruation, unspecified: Secondary | ICD-10-CM

## 2019-10-16 DIAGNOSIS — N898 Other specified noninflammatory disorders of vagina: Secondary | ICD-10-CM

## 2019-10-16 LAB — POCT PREGNANCY, URINE: Preg Test, Ur: NEGATIVE

## 2019-10-16 NOTE — Progress Notes (Signed)
Patient presents to office today reporting vaginal burning x 3 days. Denies discharge, itching or odor. Patient requests full STD testing as well. LMP approximately 5/24 but patient would like UPT today. UPT -. Patient instructed in self swab & specimen collected. Discussed results take approximately 24-48 hours and will be available in mychart and any needed prescriptions sent in. Blood work collected today as well.   Chase Caller RN BSN 10/16/19

## 2019-10-17 LAB — CERVICOVAGINAL ANCILLARY ONLY
Bacterial Vaginitis (gardnerella): POSITIVE — AB
Candida Glabrata: NEGATIVE
Candida Vaginitis: NEGATIVE
Chlamydia: NEGATIVE
Comment: NEGATIVE
Comment: NEGATIVE
Comment: NEGATIVE
Comment: NEGATIVE
Comment: NEGATIVE
Comment: NORMAL
Neisseria Gonorrhea: NEGATIVE
Trichomonas: NEGATIVE

## 2019-10-17 LAB — HEPATITIS C ANTIBODY: Hep C Virus Ab: <0.1 s/co ratio (ref 0.0–0.9)

## 2019-10-17 LAB — HEPATITIS B SURFACE ANTIGEN: Hepatitis B Surface Ag: NEGATIVE

## 2019-10-17 LAB — HIV ANTIBODY (ROUTINE TESTING W REFLEX): HIV Screen 4th Generation wRfx: NONREACTIVE

## 2019-10-17 LAB — RPR: RPR Ser Ql: NONREACTIVE

## 2019-10-29 ENCOUNTER — Telehealth (INDEPENDENT_AMBULATORY_CARE_PROVIDER_SITE_OTHER): Payer: Medicaid Other | Admitting: Lactation Services

## 2019-10-29 DIAGNOSIS — B9689 Other specified bacterial agents as the cause of diseases classified elsewhere: Secondary | ICD-10-CM

## 2019-10-29 DIAGNOSIS — N760 Acute vaginitis: Secondary | ICD-10-CM

## 2019-10-29 MED ORDER — METRONIDAZOLE 500 MG PO TABS: 500.0000 mg | ORAL_TABLET | Freq: Two times a day (BID) | ORAL | 0 refills | Status: DC

## 2019-10-29 NOTE — Telephone Encounter (Signed)
Attempted to call patient to inform her of + BV on self swab. Patient did not answer. LM on her machine to call the office for results.   Will send My Chart message to patient.

## 2019-11-04 ENCOUNTER — Encounter (HOSPITAL_COMMUNITY): Payer: Self-pay

## 2019-11-04 ENCOUNTER — Other Ambulatory Visit: Payer: Self-pay

## 2019-11-04 ENCOUNTER — Emergency Department (HOSPITAL_COMMUNITY)
Admission: EM | Admit: 2019-11-04 | Discharge: 2019-11-04 | Disposition: A | Discharge status: Home / Self Care | Payer: Medicaid Other | Attending: Emergency Medicine | Admitting: Emergency Medicine

## 2019-11-04 DIAGNOSIS — R1110 Vomiting, unspecified: Secondary | ICD-10-CM | POA: Insufficient documentation

## 2019-11-04 DIAGNOSIS — R1030 Lower abdominal pain, unspecified: Secondary | ICD-10-CM | POA: Diagnosis present

## 2019-11-04 DIAGNOSIS — Z5321 Procedure and treatment not carried out due to patient leaving prior to being seen by health care provider: Secondary | ICD-10-CM | POA: Diagnosis not present

## 2019-11-04 LAB — COMPREHENSIVE METABOLIC PANEL
ALT: 9 U/L (ref 0–44)
AST: 16 U/L (ref 15–41)
Albumin: 3.7 g/dL (ref 3.5–5.0)
Alkaline Phosphatase: 66 U/L (ref 38–126)
Anion gap: 9 (ref 5–15)
BUN: 7 mg/dL (ref 6–20)
CO2: 24 mmol/L (ref 22–32)
Calcium: 8.7 mg/dL — ABNORMAL LOW (ref 8.9–10.3)
Chloride: 103 mmol/L (ref 98–111)
Creatinine, Ser: 0.75 mg/dL (ref 0.44–1.00)
GFR calc Af Amer: >60 mL/min (ref >60)
GFR calc non Af Amer: >60 mL/min (ref >60)
Glucose, Bld: 93 mg/dL (ref 70–99)
Potassium: 3.5 mmol/L (ref 3.5–5.1)
Sodium: 136 mmol/L (ref 135–145)
Total Bilirubin: 0.3 mg/dL (ref 0.3–1.2)
Total Protein: 7.3 g/dL (ref 6.5–8.1)

## 2019-11-04 LAB — LIPASE, BLOOD: Lipase: 21 U/L (ref 11–51)

## 2019-11-04 LAB — I-STAT BETA HCG BLOOD, ED (MC, WL, AP ONLY): I-stat hCG, quantitative: <5 m[IU]/mL (ref <5)

## 2019-11-04 LAB — CBC
HCT: 34.3 % — ABNORMAL LOW (ref 36.0–46.0)
Hemoglobin: 10.4 g/dL — ABNORMAL LOW (ref 12.0–15.0)
MCH: 23.3 pg — ABNORMAL LOW (ref 26.0–34.0)
MCHC: 30.3 g/dL (ref 30.0–36.0)
MCV: 76.7 fL — ABNORMAL LOW (ref 80.0–100.0)
Platelets: 255 10*3/uL (ref 150–400)
RBC: 4.47 MIL/uL (ref 3.87–5.11)
RDW: 16.2 % — ABNORMAL HIGH (ref 11.5–15.5)
WBC: 9.1 10*3/uL (ref 4.0–10.5)
nRBC: 0 % (ref 0.0–0.2)

## 2019-11-04 LAB — URINALYSIS, ROUTINE W REFLEX MICROSCOPIC
Bilirubin Urine: NEGATIVE
Glucose, UA: NEGATIVE mg/dL
Hgb urine dipstick: NEGATIVE
Ketones, ur: NEGATIVE mg/dL
Leukocytes,Ua: NEGATIVE
Nitrite: NEGATIVE
Protein, ur: NEGATIVE mg/dL
Specific Gravity, Urine: 1.015 (ref 1.005–1.030)
pH: 7 (ref 5.0–8.0)

## 2019-11-04 MED ORDER — SODIUM CHLORIDE 0.9% FLUSH: 3.0000 mL | Freq: Once | INTRAVENOUS | Status: DC

## 2019-11-04 NOTE — ED Triage Notes (Addendum)
Pt reports intermittent lower abd pain radiating around to her lower back starting Saturday night. Pt had a negative pregnancy test yesterday. Began having intermittent vomiting as well.   Pt reports her sister is a Engineer, civil (consulting) and said she needs to be "looked at for appendicitis and have a pregnancy blood test"

## 2019-11-04 NOTE — Progress Notes (Signed)
Agree with A & P. 

## 2019-11-04 NOTE — ED Notes (Signed)
Called pt for reassess vitals no answer 

## 2019-11-04 NOTE — ED Notes (Signed)
Called for vitals x3 in lobby and outside, no answer 

## 2019-11-04 NOTE — ED Notes (Signed)
No answer for room x 1 

## 2019-11-04 NOTE — ED Notes (Signed)
PT REFUSED TO GIVE URINE SAMPLE

## 2019-11-04 NOTE — ED Notes (Signed)
Called for room multiple times with no response

## 2019-11-05 ENCOUNTER — Telehealth: Payer: Self-pay | Admitting: Obstetrics & Gynecology

## 2019-11-05 NOTE — Telephone Encounter (Signed)
Patient called state she was in the ER yesterday and want someone to explain her lab results to her.

## 2019-11-07 NOTE — Telephone Encounter (Signed)
Patient called asking for results interpretation from ED visit. Returned patients call. No answer. LM that patient needs to call ED for lab results.

## 2019-11-21 ENCOUNTER — Other Ambulatory Visit: Payer: Self-pay

## 2019-11-21 ENCOUNTER — Ambulatory Visit (HOSPITAL_COMMUNITY)
Admission: EM | Admit: 2019-11-21 | Discharge: 2019-11-21 | Disposition: A | Discharge status: Home / Self Care | Payer: Medicaid Other | Attending: Emergency Medicine | Admitting: Emergency Medicine

## 2019-11-21 ENCOUNTER — Encounter (HOSPITAL_COMMUNITY): Payer: Self-pay

## 2019-11-21 ENCOUNTER — Ambulatory Visit: Payer: Medicaid Other | Admitting: Obstetrics & Gynecology

## 2019-11-21 DIAGNOSIS — J069 Acute upper respiratory infection, unspecified: Secondary | ICD-10-CM | POA: Diagnosis not present

## 2019-11-21 DIAGNOSIS — J029 Acute pharyngitis, unspecified: Secondary | ICD-10-CM | POA: Diagnosis present

## 2019-11-21 LAB — POCT RAPID STREP A: Streptococcus, Group A Screen (Direct): NEGATIVE

## 2019-11-21 MED ORDER — IBUPROFEN 800 MG PO TABS
800.0000 mg | ORAL_TABLET | Freq: Three times a day (TID) | ORAL | 0 refills | Status: DC
Start: 2019-11-21 — End: 2020-06-29

## 2019-11-21 MED ORDER — AMOXICILLIN 500 MG PO CAPS
500.0000 mg | ORAL_CAPSULE | Freq: Two times a day (BID) | ORAL | 0 refills | Status: AC
Start: 2019-11-21 — End: 2019-12-01

## 2019-11-21 NOTE — ED Provider Notes (Signed)
[facility]    CSN: [id number] Arrival date & time: 11/21/19  0900      History   Chief Complaint Chief Complaint  Patient presents with  . Sore Throat    HPI Casey Quinn is a 29 y.o. female presenting today for evaluation of sore throat.  Patient reports over the past 2 to 3 days she has had a sore throat as well as swollen tonsils.  Patient reports that she did recently increase the amount of cigarette use over the past few days as well.  She denies associated rhinorrhea or cough.  Has had sinus congestion and postnasal drainage.  She denies any fevers chills or body aches.  Denies fatigue.  Denies close sick contacts.  Denies GI symptoms.  HPI  Past Medical History:  Diagnosis Date  . Gastroenteritis 09/14/2016  . Headache     Patient Active Problem List   Diagnosis Date Noted  . Gastroenteritis 09/14/2016  . 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 NO PAST SURGERIES      OB History    Gravida  5   Para  5   Term  5   Preterm  0   AB  0   Living  5     SAB  0   TAB  0   Ectopic  0   Multiple  0   Live Births  5            Home Medications    Prior to Admission medications   Medication Sig Start Date End Date Taking? Authorizing Provider  amoxicillin (AMOXIL) 500 MG capsule Take 1 capsule (500 mg total) by mouth 2 (two) times daily for 10 days. 11/21/19 12/01/19  Mackie Holness C, PA-C  ibuprofen (ADVIL) 800 MG tablet Take 1 tablet (800 mg total) by mouth 3 (three) times daily. 11/21/19   Gaynor Ferreras, Junius Creamer, PA-C    Family History Family History  Problem Relation Age of Onset  . Cancer Sister   . Cancer Maternal Uncle     Social History Social History   Tobacco Use  . Smoking status: Former Games developer  . Smokeless tobacco: Never Used  Substance Use Topics  . Alcohol use: No  . Drug use: No     Allergies   Patient has no known allergies.   Review of Systems Review  of Systems  Constitutional: Negative for activity change, appetite change, chills, fatigue and fever.  HENT: Positive for congestion and sore throat. Negative for ear pain, rhinorrhea, sinus pressure and trouble swallowing.   Eyes: Negative for discharge and redness.  Respiratory: Negative for cough, chest tightness and shortness of breath.   Cardiovascular: Negative for chest pain.  Gastrointestinal: Negative for abdominal pain, diarrhea, nausea and vomiting.  Musculoskeletal: Negative for myalgias.  Skin: Negative for rash.  Neurological: Negative for dizziness, light-headedness and headaches.     Physical Exam Triage Vital Signs ED Triage Vitals  Enc Vitals Group     BP      Pulse      Resp      Temp      Temp src      SpO2      Weight      Height      Head Circumference      Peak Flow      Pain Score      Pain Loc      Pain Edu?  Excl. in GC?    No data found.  Updated Vital Signs BP (!) 104/63 (BP Location: Left Arm)   Pulse 80   Temp 99.2 F (37.3 C) (Oral)   Resp 17   LMP  (Within Weeks) Comment: 3 weeks  SpO2 100%   Visual Acuity Right Eye Distance:   Left Eye Distance:   Bilateral Distance:    Right Eye Near:   Left Eye Near:    Bilateral Near:     Physical Exam Vitals and nursing note reviewed.  Constitutional:      Appearance: She is well-developed.     Comments: No acute distress  HENT:     Head: Normocephalic and atraumatic.     Ears:     Comments: Bilateral ears without tenderness to palpation of external auricle, tragus and mastoid, EAC's without erythema or swelling, TM's with good bony landmarks and cone of light. Non erythematous.     Nose: Nose normal.     Mouth/Throat:     Comments: Bilateral tonsils appear enlarged with exudate, appear pink without erythema Eyes:     Conjunctiva/sclera: Conjunctivae normal.  Cardiovascular:     Rate and Rhythm: Normal rate.  Pulmonary:     Effort: Pulmonary effort is normal. No respiratory  distress.     Comments: Breathing comfortably at rest, CTABL, no wheezing, rales or other adventitious sounds auscultated Abdominal:     General: There is no distension.  Musculoskeletal:        General: Normal range of motion.     Cervical back: Neck supple.  Skin:    General: Skin is warm and dry.  Neurological:     Mental Status: She is alert and oriented to person, place, and time.      UC Treatments / Results  Labs (all labs ordered are listed, but only abnormal results are displayed) Labs Reviewed  CULTURE, GROUP A STREP [facility])  POCT RAPID STREP A  CYTOLOGY, (ORAL, ANAL, URETHRAL) ANCILLARY ONLY    EKG   Radiology No results found.  Procedures Procedures (including critical care time)  Medications Ordered in UC Medications - No data to display  Initial Impression / Assessment and Plan / UC Course  I have reviewed the triage vital signs and the nursing notes.  Pertinent labs & imaging results that were available during my care of the patient were reviewed by me and considered in my medical decision making (see chart for details).     Strep test negative, oral cytology pending to screen for STDs.  Suspect likely symptoms partially related to postnasal drainage, recommending initiating daily antihistamine along with use of anti-inflammatories.  Tonsils do appear enlarged, but are pink and without erythema, exudate is present.  Strep culture pending.  Advised patient to monitor, provided printed prescription for amoxicillin to fill if not seeing any improvement of symptoms with use of anti-inflammatories and antihistamine over the next 2 to 3 days.  If developing increased swelling or redness to tonsils may fill prescription for amoxicillin to cover for tonsillitis.  Discussed strict return precautions. Patient verbalized understanding and is agreeable with plan.  Final Clinical Impressions(s) / UC Diagnoses   Final diagnoses:  Sore throat     Discharge  Instructions     Sore Throat  Your rapid strep tested Negative today.  Symptoms most likely viral or related to postnasal drainage.  Use ibuprofen and begin daily cetirizine/Zyrtec or loratadine/Claritin.  Please continue Tylenol or Ibuprofen for fever and pain. May try salt water  gargles, cepacol lozenges, throat spray, or OTC cold relief medicine for throat discomfort. If you also have congestion take a daily anti-histamine like Zyrtec, Claritin, and a oral decongestant to help with post nasal drip that may be irritating your throat.   Stay hydrated and drink plenty of fluids to keep your throat coated relieve irritation.     ED Prescriptions    Medication Sig Dispense Auth. Provider   ibuprofen (ADVIL) 800 MG tablet Take 1 tablet (800 mg total) by mouth 3 (three) times daily. 21 tablet Aryaman Haliburton C, PA-C   amoxicillin (AMOXIL) 500 MG capsule Take 1 capsule (500 mg total) by mouth 2 (two) times daily for 10 days. 20 capsule Quina Wilbourne, [place] C, PA-C     PDMP not reviewed this encounter.   Lew Dawes, PA-C 11/21/19 1159

## 2019-11-21 NOTE — Discharge Instructions (Signed)
Sore Throat  Your rapid strep tested Negative today.  Symptoms most likely viral or related to postnasal drainage.  Use ibuprofen and begin daily cetirizine/Zyrtec or loratadine/Claritin.  Please continue Tylenol or Ibuprofen for fever and pain. May try salt water gargles, cepacol lozenges, throat spray, or OTC cold relief medicine for throat discomfort. If you also have congestion take a daily anti-histamine like Zyrtec, Claritin, and a oral decongestant to help with post nasal drip that may be irritating your throat.   Stay hydrated and drink plenty of fluids to keep your throat coated relieve irritation.

## 2019-11-21 NOTE — ED Triage Notes (Signed)
Pt reports sore throat x 2-3 days. States the sore throat started after she increased tobacco use to 2-3 cigarettes at day. Denies fever, chills.

## 2019-11-22 LAB — CYTOLOGY, (ORAL, ANAL, URETHRAL) ANCILLARY ONLY
Chlamydia: NEGATIVE
Comment: NEGATIVE
Comment: NEGATIVE
Comment: NORMAL
Neisseria Gonorrhea: NEGATIVE
Trichomonas: NEGATIVE

## 2019-11-23 LAB — CULTURE, GROUP A STREP (THRC)

## 2020-04-29 ENCOUNTER — Ambulatory Visit (HOSPITAL_COMMUNITY)
Admission: EM | Admit: 2020-04-29 | Discharge: 2020-04-29 | Discharge status: Against Medical Advice | Payer: Medicaid Other

## 2020-04-29 ENCOUNTER — Other Ambulatory Visit: Payer: Self-pay

## 2020-06-28 ENCOUNTER — Emergency Department (HOSPITAL_COMMUNITY)
Admission: EM | Admit: 2020-06-28 | Discharge: 2020-06-28 | Disposition: A | Discharge status: Home / Self Care | Payer: Medicaid Other | Attending: Emergency Medicine | Admitting: Emergency Medicine

## 2020-06-28 ENCOUNTER — Emergency Department (HOSPITAL_COMMUNITY)
Admission: EM | Admit: 2020-06-28 | Discharge: 2020-06-28 | Disposition: A | Discharge status: Home / Self Care | Payer: Medicaid Other | Source: Home / Self Care | Attending: Emergency Medicine | Admitting: Emergency Medicine

## 2020-06-28 ENCOUNTER — Encounter (HOSPITAL_COMMUNITY): Payer: Self-pay

## 2020-06-28 ENCOUNTER — Other Ambulatory Visit: Payer: Self-pay

## 2020-06-28 DIAGNOSIS — Z87891 Personal history of nicotine dependence: Secondary | ICD-10-CM | POA: Insufficient documentation

## 2020-06-28 DIAGNOSIS — R109 Unspecified abdominal pain: Secondary | ICD-10-CM | POA: Insufficient documentation

## 2020-06-28 DIAGNOSIS — R1110 Vomiting, unspecified: Secondary | ICD-10-CM | POA: Insufficient documentation

## 2020-06-28 DIAGNOSIS — Z5321 Procedure and treatment not carried out due to patient leaving prior to being seen by health care provider: Secondary | ICD-10-CM | POA: Insufficient documentation

## 2020-06-28 DIAGNOSIS — R112 Nausea with vomiting, unspecified: Secondary | ICD-10-CM | POA: Insufficient documentation

## 2020-06-28 LAB — COMPREHENSIVE METABOLIC PANEL
ALT: 12 U/L (ref 0–44)
AST: 21 U/L (ref 15–41)
Albumin: 4.2 g/dL (ref 3.5–5.0)
Alkaline Phosphatase: 61 U/L (ref 38–126)
Anion gap: 14 (ref 5–15)
BUN: 9 mg/dL (ref 6–20)
CO2: 18 mmol/L — ABNORMAL LOW (ref 22–32)
Calcium: 9.2 mg/dL (ref 8.9–10.3)
Chloride: 107 mmol/L (ref 98–111)
Creatinine, Ser: 0.9 mg/dL (ref 0.44–1.00)
GFR, Estimated: >60 mL/min (ref >60)
Glucose, Bld: 152 mg/dL — ABNORMAL HIGH (ref 70–99)
Potassium: 3 mmol/L — ABNORMAL LOW (ref 3.5–5.1)
Sodium: 139 mmol/L (ref 135–145)
Total Bilirubin: 0.9 mg/dL (ref 0.3–1.2)
Total Protein: 8.2 g/dL — ABNORMAL HIGH (ref 6.5–8.1)

## 2020-06-28 LAB — CBC WITH DIFFERENTIAL/PLATELET
Abs Immature Granulocytes: 0.06 10*3/uL (ref 0.00–0.07)
Basophils Absolute: 0.1 10*3/uL (ref 0.0–0.1)
Basophils Relative: 0 %
Eosinophils Absolute: 0 10*3/uL (ref 0.0–0.5)
Eosinophils Relative: 0 %
HCT: 32.3 % — ABNORMAL LOW (ref 36.0–46.0)
Hemoglobin: 10.3 g/dL — ABNORMAL LOW (ref 12.0–15.0)
Immature Granulocytes: 1 %
Lymphocytes Relative: 19 %
Lymphs Abs: 2.3 10*3/uL (ref 0.7–4.0)
MCH: 22.9 pg — ABNORMAL LOW (ref 26.0–34.0)
MCHC: 31.9 g/dL (ref 30.0–36.0)
MCV: 71.9 fL — ABNORMAL LOW (ref 80.0–100.0)
Monocytes Absolute: 0.8 10*3/uL (ref 0.1–1.0)
Monocytes Relative: 7 %
Neutro Abs: 8.5 10*3/uL — ABNORMAL HIGH (ref 1.7–7.7)
Neutrophils Relative %: 73 %
Platelets: 312 10*3/uL (ref 150–400)
RBC: 4.49 MIL/uL (ref 3.87–5.11)
RDW: 18.9 % — ABNORMAL HIGH (ref 11.5–15.5)
WBC: 11.7 10*3/uL — ABNORMAL HIGH (ref 4.0–10.5)
nRBC: 0 % (ref 0.0–0.2)

## 2020-06-28 LAB — I-STAT BETA HCG BLOOD, ED (MC, WL, AP ONLY): I-stat hCG, quantitative: <5 m[IU]/mL (ref <5)

## 2020-06-28 LAB — LIPASE, BLOOD: Lipase: 29 U/L (ref 11–51)

## 2020-06-28 MED ORDER — SODIUM CHLORIDE 0.9 % IV BOLUS
500.0000 mL | Freq: Once | INTRAVENOUS | Status: AC
  Administered 2020-06-28: 500 mL via INTRAVENOUS

## 2020-06-28 MED ORDER — HALOPERIDOL LACTATE 5 MG/ML IJ SOLN
5.0000 mg | Freq: Once | INTRAMUSCULAR | Status: AC
  Administered 2020-06-28: 5 mg via INTRAVENOUS
  Filled 2020-06-28: qty 1

## 2020-06-28 MED ORDER — SODIUM CHLORIDE 0.9 % IV BOLUS
1000.0000 mL | Freq: Once | INTRAVENOUS | Status: AC
  Administered 2020-06-28: 1000 mL via INTRAVENOUS

## 2020-06-28 MED ORDER — ONDANSETRON HCL 4 MG/2ML IJ SOLN
4.0000 mg | Freq: Once | INTRAMUSCULAR | Status: AC
  Administered 2020-06-28: 4 mg via INTRAVENOUS
  Filled 2020-06-28: qty 2

## 2020-06-28 MED ORDER — POTASSIUM CHLORIDE 10 MEQ/100ML IV SOLN
10.0000 meq | Freq: Once | INTRAVENOUS | Status: AC
  Administered 2020-06-28: 10 meq via INTRAVENOUS
  Filled 2020-06-28: qty 100

## 2020-06-28 MED ORDER — ONDANSETRON HCL 4 MG PO TABS
4.0000 mg | ORAL_TABLET | Freq: Four times a day (QID) | ORAL | 0 refills | Status: AC
Start: 2020-06-28 — End: 2020-07-02

## 2020-06-28 NOTE — ED Notes (Signed)
Tried to assist patient into gown, patient throws emesis bag in floor and starts trying to vomit in floor, explained to patient to please use the emesis bag. Patient is provided new bag. Patient refuses gown at this time. Patient tossing and turning in bed, bilateral bed rails are up and bed locked and in low position.

## 2020-06-28 NOTE — ED Provider Notes (Addendum)
[facility]EM[facility]ENC[facility]PT Provider Note   CSN: [id number] Arrival date & time: 06/28/20  1275     History Chief Complaint  Patient presents with  . Abdominal Pain  . Emesis    Casey Quinn is a 30 y.o. female.  The history is provided by the patient.  Emesis Severity:  Moderate Timing:  Constant Progression:  Unchanged Chronicity:  New Relieved by:  Nothing Worsened by:  Nothing Associated symptoms: abdominal pain (cramping)   Associated symptoms: no arthralgias, no chills, no cough, no diarrhea, no fever, no headaches, no myalgias, no sore throat and no URI   Risk factors comment:  THC use      Past Medical History:  Diagnosis Date  . Gastroenteritis 09/14/2016  . Headache     Patient Active Problem List   Diagnosis Date Noted  . Gastroenteritis 09/14/2016  . 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 NO PAST SURGERIES       OB History    Gravida  5   Para  5   Term  5   Preterm  0   AB  0   Living  5     SAB  0   IAB  0   Ectopic  0   Multiple  0   Live Births  5           Family History  Problem Relation Age of Onset  . Cancer Sister   . Cancer Maternal Uncle     Social History   Tobacco Use  . Smoking status: Former Games developer  . Smokeless tobacco: Never Used  Substance Use Topics  . Alcohol use: No  . Drug use: No    Home Medications Prior to Admission medications   Medication Sig Start Date End Date Taking? Authorizing Provider  ondansetron (ZOFRAN) 4 MG tablet Take 1 tablet (4 mg total) by mouth every 6 (six) hours for 15 doses. 06/28/20 07/02/20 Yes Cordaryl Decelles, DO  ibuprofen (ADVIL) 800 MG tablet Take 1 tablet (800 mg total) by mouth 3 (three) times daily. Patient not taking: Reported on 06/28/2020 11/21/19   Patterson Hammersmith C, PA-C    Allergies    Patient has no known allergies.  Review of Systems   Review of Systems  Constitutional:  Negative for chills and fever.  HENT: Negative for ear pain and sore throat.   Eyes: Negative for pain and visual disturbance.  Respiratory: Negative for cough and shortness of breath.   Cardiovascular: Negative for chest pain and palpitations.  Gastrointestinal: Positive for abdominal pain (cramping), nausea and vomiting. Negative for diarrhea.  Genitourinary: Negative for dysuria, hematuria, vaginal bleeding and vaginal discharge.  Musculoskeletal: Negative for arthralgias, back pain and myalgias.  Skin: Negative for color change and rash.  Neurological: Negative for seizures, syncope and headaches.  All other systems reviewed and are negative.   Physical Exam Updated Vital Signs  ED Triage Vitals  Enc Vitals Group     BP 06/28/20 0845 122/76     Pulse Rate 06/28/20 0845 61     Resp 06/28/20 0845 20     Temp 06/28/20 0845 98 F (36.7 C)     Temp src --      SpO2 06/28/20 0836 98 %     Weight 06/28/20 0846 196 lb 3.4 oz (89 kg)     Height 06/28/20 0846 5\' 2"  (1.575 m)     Head Circumference --  Peak Flow --      Pain Score 06/28/20 0846 10     Pain Loc --      Pain Edu? --      Excl. in GC? --     Physical Exam Vitals and nursing note reviewed.  Constitutional:      General: She is not in acute distress.    Appearance: She is well-developed and well-nourished. She is not ill-appearing.  HENT:     Head: Normocephalic and atraumatic.  Eyes:     Extraocular Movements: Extraocular movements intact.     Conjunctiva/sclera: Conjunctivae normal.     Pupils: Pupils are equal, round, and reactive to light.  Cardiovascular:     Rate and Rhythm: Normal rate and regular rhythm.     Heart sounds: No murmur heard.   Pulmonary:     Effort: Pulmonary effort is normal. No respiratory distress.     Breath sounds: Normal breath sounds.  Abdominal:     General: Abdomen is flat. There is no distension.     Palpations: Abdomen is soft.     Tenderness: There is no abdominal  tenderness. There is no right CVA tenderness, left CVA tenderness, guarding or rebound.  Musculoskeletal:        General: No edema.     Cervical back: Neck supple.  Skin:    General: Skin is warm and dry.     Capillary Refill: Capillary refill takes less than 2 seconds.  Neurological:     General: No focal deficit present.     Mental Status: She is alert.  Psychiatric:        Mood and Affect: Mood and affect normal.     ED Results / Procedures / Treatments   Labs (all labs ordered are listed, but only abnormal results are displayed) Labs Reviewed  CBC WITH DIFFERENTIAL/PLATELET - Abnormal; Notable for the following components:      Result Value   WBC 11.7 (*)    Hemoglobin 10.3 (*)    HCT 32.3 (*)    MCV 71.9 (*)    MCH 22.9 (*)    RDW 18.9 (*)    Neutro Abs 8.5 (*)    All other components within normal limits  COMPREHENSIVE METABOLIC PANEL - Abnormal; Notable for the following components:   Potassium 3.0 (*)    CO2 18 (*)    Glucose, Bld 152 (*)    Total Protein 8.2 (*)    All other components within normal limits  LIPASE, BLOOD  I-STAT BETA HCG BLOOD, ED ([facility], [facility], [facility] ONLY)    EKG None  Radiology No results found.  Procedures Procedures   Medications Ordered in ED Medications  haloperidol lactate (HALDOL) injection 5 mg (5 mg Intravenous Given 06/28/20 0900)  sodium chloride 0.9 % bolus 1,000 mL (1,000 mLs Intravenous New Bag/Given 06/28/20 0900)  sodium chloride 0.9 % bolus 500 mL (500 mLs Intravenous New Bag/Given 06/28/20 1001)  potassium chloride 10 mEq in 100 mL IVPB (10 mEq Intravenous New Bag/Given 06/28/20 1024)  ondansetron (ZOFRAN) injection 4 mg (4 mg Intravenous Given 06/28/20 1023)    ED Course  I have reviewed the triage vital signs and the nursing notes.  Pertinent labs & imaging results that were available during my care of the patient were reviewed by me and considered in my medical decision making (see chart for details).    MDM  Rules/Calculators/A&P  Casey Quinn is a 30 year old female with no significant medical history presents the ED with nausea and vomiting.  Normal vitals.  No fever.  Symptoms started last night.  Admits to marijuana use.  Denies any fever, chills.  Abdomen is overall nontender.  She denies any urinary symptoms.  Overall suspect marijuana hyperemesis.  No flank pain doubt kidney stones or UTI.  Lipase is normal doubt pancreatitis.  No significant anemia, leukocytosis, electrolyte abnormality.  Patient felt significantly better after IV fluids, IV Zofran, IV Haldol.  Educated about marijuana use.  Written for Zofran and discharged in ED in good condition.  No concern for appendicitis or bowel obstruction.  Pregnancy test negative.  This chart was dictated using voice recognition software.  Despite best efforts to proofread,  errors can occur which can change the documentation meaning.    Final Clinical Impression(s) / ED Diagnoses Final diagnoses:  Nausea and vomiting, intractability of vomiting not specified, unspecified vomiting type    Rx / DC Orders ED Discharge Orders         Ordered    ondansetron (ZOFRAN) 4 MG tablet  Every 6 hours        06/28/20 1128           Virgina Norfolk, DO 06/28/20 1130    Virgina Norfolk, DO 06/28/20 1130

## 2020-06-28 NOTE — ED Triage Notes (Signed)
Generalized abd pain/N/V  X 6 hours. Uses THC daily per patient. Patient left [facility] due to wait.

## 2020-06-28 NOTE — ED Notes (Signed)
Patient tolerating sprite at this time

## 2020-06-28 NOTE — ED Notes (Signed)
Patient screaming out help me help me. Medication administered and explained that she has to give it time to work.

## 2020-06-28 NOTE — ED Notes (Signed)
Patient left per registration

## 2020-06-29 ENCOUNTER — Other Ambulatory Visit: Payer: Self-pay

## 2020-06-29 ENCOUNTER — Encounter (HOSPITAL_COMMUNITY): Payer: Self-pay | Admitting: *Deleted

## 2020-06-29 ENCOUNTER — Ambulatory Visit (HOSPITAL_COMMUNITY)
Admission: EM | Admit: 2020-06-29 | Discharge: 2020-06-29 | Disposition: A | Discharge status: Home / Self Care | Payer: Medicaid Other | Attending: Student | Admitting: Student

## 2020-06-29 DIAGNOSIS — R112 Nausea with vomiting, unspecified: Secondary | ICD-10-CM | POA: Diagnosis not present

## 2020-06-29 MED ORDER — ONDANSETRON HCL 4 MG/2ML IJ SOLN
INTRAMUSCULAR | Status: AC
  Filled 2020-06-29: qty 4

## 2020-06-29 MED ORDER — PROMETHAZINE HCL 25 MG/ML IJ SOLN: 25.0000 mg | Freq: Once | INTRAMUSCULAR | Status: DC

## 2020-06-29 MED ORDER — ONDANSETRON HCL 4 MG/2ML IJ SOLN
8.0000 mg | Freq: Once | INTRAMUSCULAR | Status: AC
  Administered 2020-06-29: 8 mg via INTRAMUSCULAR

## 2020-06-29 NOTE — Discharge Instructions (Addendum)
-  The Phenergan injection should help you stop vomiting.  This can cause drowsiness, so make sure that your mom drives you home. -You can continue using Zofran that the ER gave you. -make sure to drink plenty of fluids at home, and eat bland foods when your stomach will let you. -Come back and see [place] if you are not able to keep fluids down despite this new medication; head straight to the ER if you experience worsening of abdominal pain, bloody stool, new/worsening fever/chills, chest pain, shortness of breath, etc.

## 2020-06-29 NOTE — ED Triage Notes (Signed)
Pt vomiting since Sat. Pt was treated at [facility] and DC home.

## 2020-06-29 NOTE — ED Provider Notes (Signed)
[facility]    CSN: [id number] Arrival date & time: 06/29/20  1648      History   Chief Complaint Chief Complaint  Patient presents with  . Emesis    HPI Casey Quinn is a 30 y.o. female presenting with continued nausea and vomiting following ED visit for the same on 06/28/2020 (1 day ago).  This patient was discharged from [facility], ER about 18 hours ago following improvement in her condition with IV Zofran, IV fluids, Haldol.  She was discharged on Zofran p.o.  Patient states that the Zofran improved her symptoms for a few hours, but then they returned.  She is unable to keep any fluids or food down.  She is not able to quantify how much she is vomiting.  Denies diarrhea.  Endorses generalized crampy abdominal pain. This patient is also a current marijuana user.  Denies urinary symptoms.  Denies URI symptoms.  HPI  Past Medical History:  Diagnosis Date  . Gastroenteritis 09/14/2016  . Headache     Patient Active Problem List   Diagnosis Date Noted  . Gastroenteritis 09/14/2016  . 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 NO PAST SURGERIES      OB History    Gravida  5   Para  5   Term  5   Preterm  0   AB  0   Living  5     SAB  0   IAB  0   Ectopic  0   Multiple  0   Live Births  5            Home Medications    Prior to Admission medications   Medication Sig Start Date End Date Taking? Authorizing Provider  ondansetron (ZOFRAN) 4 MG tablet Take 1 tablet (4 mg total) by mouth every 6 (six) hours for 15 doses. 06/28/20 07/02/20  Virgina Norfolk, DO    Family History Family History  Problem Relation Age of Onset  . Cancer Sister   . Cancer Maternal Uncle     Social History Social History   Tobacco Use  . Smoking status: Former Games developer  . Smokeless tobacco: Never Used  Substance Use Topics  . Alcohol use: No  . Drug use: Yes    Types: Marijuana    Comment: Pt smokes  Marijuana daily     Allergies   Patient has no known allergies.   Review of Systems Review of Systems  Constitutional: Negative for appetite change, chills, diaphoresis, fever and unexpected weight change.  HENT: Negative for congestion, ear pain, sinus pressure, sinus pain, sneezing, sore throat and trouble swallowing.   Respiratory: Negative for cough, chest tightness and shortness of breath.   Cardiovascular: Negative for chest pain.  Gastrointestinal: Positive for abdominal pain, nausea and vomiting. Negative for abdominal distention, anal bleeding, blood in stool, constipation, diarrhea and rectal pain.  Genitourinary: Negative for dysuria, flank pain, frequency and urgency.  Musculoskeletal: Negative for back pain and myalgias.  Neurological: Negative for dizziness, light-headedness and headaches.  All other systems reviewed and are negative.    Physical Exam Triage Vital Signs ED Triage Vitals  Enc Vitals Group     BP 06/29/20 1744 (!) 136/59     Pulse Rate 06/29/20 1744 (!) 54     Resp 06/29/20 1744 18     Temp 06/29/20 1744 98.6 F (37 C)     Temp Source 06/29/20 1744 Oral  SpO2 06/29/20 1744 98 %     Weight --      Height --      Head Circumference --      Peak Flow --      Pain Score 06/29/20 1747 10     Pain Loc --      Pain Edu? --      Excl. in GC? --    No data found.  Updated Vital Signs BP (!) 136/59 (BP Location: Left Arm)   Pulse 64   Temp 98.6 F (37 C) (Oral)   Resp 18   LMP 06/26/2020   SpO2 98%   Visual Acuity Right Eye Distance:   Left Eye Distance:   Bilateral Distance:    Right Eye Near:   Left Eye Near:    Bilateral Near:     Physical Exam Vitals reviewed.  Constitutional:      General: She is not in acute distress.    Appearance: Normal appearance. She is ill-appearing.     Comments: Appears well hydrated. Moist mucous membranes, cap refill <2 seconds, good skin turgor.  HENT:     Head: Normocephalic and atraumatic.   Cardiovascular:     Rate and Rhythm: Normal rate and regular rhythm.     Heart sounds: Normal heart sounds.  Pulmonary:     Effort: Pulmonary effort is normal.     Breath sounds: Normal breath sounds. No wheezing, rhonchi or rales.  Abdominal:     General: Bowel sounds are normal. There is no distension.     Palpations: Abdomen is soft. There is no mass.     Tenderness: There is generalized abdominal tenderness. There is no right CVA tenderness, left CVA tenderness, guarding or rebound. Negative signs include Murphy's sign, Rovsing's sign and McBurney's sign.  Neurological:     General: No focal deficit present.     Mental Status: She is alert and oriented to person, place, and time.  Psychiatric:        Mood and Affect: Mood normal.        Behavior: Behavior normal.      UC Treatments / Results  Labs (all labs ordered are listed, but only abnormal results are displayed) Labs Reviewed - No data to display  EKG   Radiology No results found.  Procedures Procedures (including critical care time)  Medications Ordered in UC Medications  ondansetron (ZOFRAN) injection 8 mg (8 mg Intramuscular Given 06/29/20 1845)    Initial Impression / Assessment and Plan / UC Course  I have reviewed the triage vital signs and the nursing notes.  Pertinent labs & imaging results that were available during my care of the patient were reviewed by me and considered in my medical decision making (see chart for details).     This patient is a 30 year old female presenting with continued nausea and vomiting following ED visit for the same 1 day ago. She was treated with IV fluids, zofran and IV haldol with significant improvement; discharged with zofran and sent home.  In the ER, lipase within normal range.  Labs also negative for anemia, leukocytosis, electrolyte abnormality.  Negative pregnancy test.  She is currently  afebrile nontachycardic nontachypneic, oxygenating well on room air, with  generalized crampy abd pain. Appears well hydrated.Marland Kitchen  Zofran IM administered today, as we do not have phenergan at this location.  She is not pregnant.  She is driven today by her mom.  Return precautions-inability to hydrate mouth, new/worsening abdominal pain, chest pain,  shortness of breath, etc.  This chart was dictated using voice recognition software, Dragon. Despite the best efforts of this provider to proofread and correct errors, errors may still occur which can change documentation meaning.   Final Clinical Impressions(s) / UC Diagnoses   Final diagnoses:  Intractable nausea and vomiting     Discharge Instructions     -The Phenergan injection should help you stop vomiting.  This can cause drowsiness, so make sure that your mom drives you home. -You can continue using Zofran that the ER gave you. -make sure to drink plenty of fluids at home, and eat bland foods when your stomach will let you. -Come back and see [place] if you are not able to keep fluids down despite this new medication; head straight to the ER if you experience worsening of abdominal pain, bloody stool, new/worsening fever/chills, chest pain, shortness of breath, etc.    ED Prescriptions    None     PDMP not reviewed this encounter.   Rhys Martini, PA-C 06/29/20 1909

## 2020-06-30 ENCOUNTER — Emergency Department (HOSPITAL_COMMUNITY): Payer: Medicaid Other

## 2020-06-30 ENCOUNTER — Emergency Department (HOSPITAL_COMMUNITY)
Admission: EM | Admit: 2020-06-30 | Discharge: 2020-06-30 | Disposition: A | Discharge status: Home / Self Care | Payer: Medicaid Other | Attending: Emergency Medicine | Admitting: Emergency Medicine

## 2020-06-30 ENCOUNTER — Other Ambulatory Visit: Payer: Self-pay

## 2020-06-30 ENCOUNTER — Encounter (HOSPITAL_COMMUNITY): Payer: Self-pay

## 2020-06-30 DIAGNOSIS — Z87891 Personal history of nicotine dependence: Secondary | ICD-10-CM | POA: Diagnosis not present

## 2020-06-30 DIAGNOSIS — E876 Hypokalemia: Secondary | ICD-10-CM

## 2020-06-30 DIAGNOSIS — R1115 Cyclical vomiting syndrome unrelated to migraine: Secondary | ICD-10-CM | POA: Insufficient documentation

## 2020-06-30 DIAGNOSIS — R0789 Other chest pain: Secondary | ICD-10-CM | POA: Diagnosis not present

## 2020-06-30 DIAGNOSIS — R1084 Generalized abdominal pain: Secondary | ICD-10-CM | POA: Diagnosis not present

## 2020-06-30 DIAGNOSIS — R1110 Vomiting, unspecified: Secondary | ICD-10-CM | POA: Diagnosis present

## 2020-06-30 LAB — CBC WITH DIFFERENTIAL/PLATELET
Abs Immature Granulocytes: 0.03 10*3/uL (ref 0.00–0.07)
Basophils Absolute: 0.1 10*3/uL (ref 0.0–0.1)
Basophils Relative: 1 %
Eosinophils Absolute: 0 10*3/uL (ref 0.0–0.5)
Eosinophils Relative: 0 %
HCT: 33.5 % — ABNORMAL LOW (ref 36.0–46.0)
Hemoglobin: 10.8 g/dL — ABNORMAL LOW (ref 12.0–15.0)
Immature Granulocytes: 0 %
Lymphocytes Relative: 26 %
Lymphs Abs: 2.9 10*3/uL (ref 0.7–4.0)
MCH: 23.1 pg — ABNORMAL LOW (ref 26.0–34.0)
MCHC: 32.2 g/dL (ref 30.0–36.0)
MCV: 71.7 fL — ABNORMAL LOW (ref 80.0–100.0)
Monocytes Absolute: 1 10*3/uL (ref 0.1–1.0)
Monocytes Relative: 9 %
Neutro Abs: 7 10*3/uL (ref 1.7–7.7)
Neutrophils Relative %: 64 %
Platelets: 313 10*3/uL (ref 150–400)
RBC: 4.67 MIL/uL (ref 3.87–5.11)
RDW: 18.6 % — ABNORMAL HIGH (ref 11.5–15.5)
WBC: 11 10*3/uL — ABNORMAL HIGH (ref 4.0–10.5)
nRBC: 0 % (ref 0.0–0.2)

## 2020-06-30 LAB — COMPREHENSIVE METABOLIC PANEL
ALT: 18 U/L (ref 0–44)
AST: 27 U/L (ref 15–41)
Albumin: 4.1 g/dL (ref 3.5–5.0)
Alkaline Phosphatase: 58 U/L (ref 38–126)
Anion gap: 12 (ref 5–15)
BUN: 11 mg/dL (ref 6–20)
CO2: 24 mmol/L (ref 22–32)
Calcium: 9.3 mg/dL (ref 8.9–10.3)
Chloride: 101 mmol/L (ref 98–111)
Creatinine, Ser: 0.9 mg/dL (ref 0.44–1.00)
GFR, Estimated: >60 mL/min (ref >60)
Glucose, Bld: 101 mg/dL — ABNORMAL HIGH (ref 70–99)
Potassium: 3.1 mmol/L — ABNORMAL LOW (ref 3.5–5.1)
Sodium: 137 mmol/L (ref 135–145)
Total Bilirubin: 0.8 mg/dL (ref 0.3–1.2)
Total Protein: 8.1 g/dL (ref 6.5–8.1)

## 2020-06-30 LAB — I-STAT BETA HCG BLOOD, ED (MC, WL, AP ONLY): I-stat hCG, quantitative: <5 m[IU]/mL (ref <5)

## 2020-06-30 LAB — MAGNESIUM: Magnesium: 1.7 mg/dL (ref 1.7–2.4)

## 2020-06-30 LAB — LIPASE, BLOOD: Lipase: 29 U/L (ref 11–51)

## 2020-06-30 MED ORDER — HYDROMORPHONE HCL 1 MG/ML IJ SOLN
1.0000 mg | Freq: Once | INTRAMUSCULAR | Status: AC
  Administered 2020-06-30: 1 mg via INTRAVENOUS
  Filled 2020-06-30: qty 1

## 2020-06-30 MED ORDER — METOCLOPRAMIDE HCL 5 MG/ML IJ SOLN
10.0000 mg | Freq: Once | INTRAMUSCULAR | Status: AC
  Administered 2020-06-30: 10 mg via INTRAVENOUS
  Filled 2020-06-30: qty 2

## 2020-06-30 MED ORDER — IOHEXOL 300 MG/ML  SOLN
100.0000 mL | Freq: Once | INTRAMUSCULAR | Status: AC | PRN
  Administered 2020-06-30: 100 mL via INTRAVENOUS

## 2020-06-30 MED ORDER — POTASSIUM CHLORIDE CRYS ER 20 MEQ PO TBCR: 20.0000 meq | EXTENDED_RELEASE_TABLET | Freq: Every day | ORAL | 0 refills | Status: AC

## 2020-06-30 MED ORDER — METOCLOPRAMIDE HCL 10 MG PO TABS: 10.0000 mg | ORAL_TABLET | Freq: Three times a day (TID) | ORAL | 0 refills | Status: DC | PRN

## 2020-06-30 MED ORDER — LACTATED RINGERS IV BOLUS
1000.0000 mL | Freq: Once | INTRAVENOUS | Status: AC
  Administered 2020-06-30: 1000 mL via INTRAVENOUS

## 2020-06-30 MED ORDER — POTASSIUM CHLORIDE CRYS ER 20 MEQ PO TBCR
40.0000 meq | EXTENDED_RELEASE_TABLET | Freq: Once | ORAL | Status: AC
  Administered 2020-06-30: 40 meq via ORAL
  Filled 2020-06-30: qty 2

## 2020-06-30 NOTE — Discharge Instructions (Signed)
If you develop worsening, continued, or recurrent abdominal pain, uncontrolled vomiting, fever, chest or back pain, or any other new/concerning symptoms then return to the ER for evaluation.  

## 2020-06-30 NOTE — ED Notes (Signed)
Pt given water and crackers for PO challenge

## 2020-06-30 NOTE — ED Provider Notes (Signed)
[facility]-EMERGENCY DEPT Provider Note   CSN: [id number] Arrival date & time: 06/30/20  1953     History Chief Complaint  Patient presents with  . Vomiting    Casey Quinn is a 30 y.o. female.  HPI 30 year old female presents with intractable vomiting. Has been having vomiting for 3 days. Seen here 2 days ago and urgent care yesterday. Was prescribed zofran which has not helped. Has diffuse abdominal discomfort. No fevers. Has had a small amount of blood when vomiting. Some chest discomfort from vomiting. Has had this happen on and off since the summer, but has not received a formal diagnosis for why this happens.   Past Medical History:  Diagnosis Date  . Gastroenteritis 09/14/2016  . Headache     Patient Active Problem List   Diagnosis Date Noted  . Gastroenteritis 09/14/2016  . 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 NO PAST SURGERIES       OB History    Gravida  5   Para  5   Term  5   Preterm  0   AB  0   Living  5     SAB  0   IAB  0   Ectopic  0   Multiple  0   Live Births  5           Family History  Problem Relation Age of Onset  . Cancer Sister   . Cancer Maternal Uncle     Social History   Tobacco Use  . Smoking status: Former Games developer  . Smokeless tobacco: Never Used  Substance Use Topics  . Alcohol use: No  . Drug use: Yes    Types: Marijuana    Comment: Pt smokes Marijuana daily    Home Medications Prior to Admission medications   Medication Sig Start Date End Date Taking? Authorizing Provider  metoCLOPramide (REGLAN) 10 MG tablet Take 1 tablet (10 mg total) by mouth every 8 (eight) hours as needed for nausea or vomiting. 06/30/20  Yes Pricilla Loveless, MD  ondansetron (ZOFRAN) 4 MG tablet Take 1 tablet (4 mg total) by mouth every 6 (six) hours for 15 doses. 06/28/20 07/02/20 Yes Curatolo, Adam, DO    Allergies    Patient has no known  allergies.  Review of Systems   Review of Systems  Constitutional: Negative for fever.  Cardiovascular: Positive for chest pain.  Gastrointestinal: Positive for abdominal pain and vomiting.  Genitourinary: Negative for dysuria.  All other systems reviewed and are negative.   Physical Exam Updated Vital Signs BP 118/78   Pulse (!) 54   Temp 99 F (37.2 C)   Resp 14   Ht 5\' 2"  (1.575 m)   Wt 74.8 kg   LMP 06/26/2020   SpO2 99%   BMI 30.18 kg/m   Physical Exam Vitals and nursing note reviewed.  Constitutional:      General: She is not in acute distress.    Appearance: She is well-developed and well-nourished. She is not ill-appearing or diaphoretic.  HENT:     Head: Normocephalic and atraumatic.     Right Ear: External ear normal.     Left Ear: External ear normal.     Nose: Nose normal.  Eyes:     General:        Right eye: No discharge.        Left eye: No discharge.  Cardiovascular:  Rate and Rhythm: Normal rate and regular rhythm.     Heart sounds: Normal heart sounds.  Pulmonary:     Effort: Pulmonary effort is normal.     Breath sounds: Normal breath sounds.  Abdominal:     Palpations: Abdomen is soft.     Tenderness: There is abdominal tenderness (generalized).  Skin:    General: Skin is warm and dry.  Neurological:     Mental Status: She is alert.  Psychiatric:        Mood and Affect: Mood is not anxious.     ED Results / Procedures / Treatments   Labs (all labs ordered are listed, but only abnormal results are displayed) Labs Reviewed  COMPREHENSIVE METABOLIC PANEL - Abnormal; Notable for the following components:      Result Value   Potassium 3.1 (*)    Glucose, Bld 101 (*)    All other components within normal limits  CBC WITH DIFFERENTIAL/PLATELET - Abnormal; Notable for the following components:   WBC 11.0 (*)    Hemoglobin 10.8 (*)    HCT 33.5 (*)    MCV 71.7 (*)    MCH 23.1 (*)    RDW 18.6 (*)    All other components within  normal limits  LIPASE, BLOOD  MAGNESIUM  URINALYSIS, ROUTINE W REFLEX MICROSCOPIC  I-STAT BETA HCG BLOOD, ED ([facility], [facility], [facility] ONLY)    EKG EKG Interpretation  Date/Time:  Tuesday June 30 2020 20:22:13 EST Ventricular Rate:  61 PR Interval:    QRS Duration: 83 QT Interval:  439 QTC Calculation: 443 R Axis:   77 Text Interpretation: Sinus rhythm no acute ST/T changes No old tracing to compare Confirmed by Pricilla Loveless [phone]) on 06/30/2020 8:27:15 PM   Radiology CT ABDOMEN PELVIS W CONTRAST  Result Date: 06/30/2020 CLINICAL DATA:  Nausea and vomiting.  Abdominal pain. EXAM: CT ABDOMEN AND PELVIS WITH CONTRAST TECHNIQUE: Multidetector CT imaging of the abdomen and pelvis was performed using the standard protocol following bolus administration of intravenous contrast. CONTRAST:  OMNIPAQUE IOHEXOL 300 MG/ML  SOLN COMPARISON:  None. FINDINGS: Lower chest: Lung bases are clear. Hepatobiliary: Minimal focal fatty infiltration adjacent to the falciform ligament. No focal hepatic lesion. Gallbladder physiologically distended, no calcified stone. No biliary dilatation. Pancreas: No ductal dilatation or inflammation. Spleen: Normal in size without focal abnormality. Adrenals/Urinary Tract: Normal adrenal glands. No hydronephrosis, renal calculi, or perinephric edema. Minimal focal cortical scarring in the upper most right kidney. Unremarkable urinary bladder. Stomach/Bowel: Decompressed stomach. No small bowel obstruction or inflammation. Normal appendix. Small volume of stool in the proximal colon. More distal colon is decompressed limiting assessment for wall thickening. There is no pericolonic edema. Vascular/Lymphatic: Normal caliber abdominal aorta. Patent portal vein. No acute vascular findings. No enlarged lymph nodes in the abdomen or pelvis. Reproductive: Retroverted uterus. Ovaries are symmetric in size. No suspicious adnexal mass. Small amount of free fluid in the pelvis is likely  physiologic. Other: No upper abdominal ascites. No free air or focal fluid collection. Musculoskeletal: There are no acute or suspicious osseous abnormalities. IMPRESSION: No acute abnormality in the abdomen/pelvis or explanation for abdominal pain. Electronically Signed   By: Narda Rutherford M.D.   On: 06/30/2020 22:17   DG Chest Portable 1 View  Result Date: 06/30/2020 CLINICAL DATA:  Chest pain, vomiting EXAM: PORTABLE CHEST 1 VIEW COMPARISON:  None. FINDINGS: The heart size and mediastinal contours are within normal limits. Both lungs are clear. The visualized skeletal structures are unremarkable. IMPRESSION:  No active disease. Electronically Signed   By: Sharlet Salina M.D.   On: 06/30/2020 21:04    Procedures Procedures   Medications Ordered in ED Medications  metoCLOPramide (REGLAN) injection 10 mg (10 mg Intravenous Given 06/30/20 2031)  lactated ringers bolus 1,000 mL (0 mLs Intravenous Stopped 06/30/20 2205)  HYDROmorphone (DILAUDID) injection 1 mg (1 mg Intravenous Given 06/30/20 2138)  iohexol (OMNIPAQUE) 300 MG/ML solution 100 mL (100 mLs Intravenous Contrast Given 06/30/20 2200)  potassium chloride SA (KLOR-CON) CR tablet 40 mEq (40 mEq Oral Given 06/30/20 2247)    ED Course  I have reviewed the triage vital signs and the nursing notes.  Pertinent labs & imaging results that were available during my care of the patient were reviewed by me and considered in my medical decision making (see chart for details).    MDM Rules/Calculators/A&P                          Patient presents with what is probably cyclic vomiting.  I discussed that this may be due to her marijuana use though she states this preceded her abusing marijuana.  At this point, she has not had any further vomiting and her pain is controlled.  She is able to tolerate potassium.  I did get a CT of her abdomen as she has not had a previous work-up for this and this is benign.  She appears stable for discharge home. Final  Clinical Impression(s) / ED Diagnoses Final diagnoses:  Cyclic vomiting syndrome    Rx / DC Orders ED Discharge Orders         Ordered    Ambulatory referral to Gastroenterology        06/30/20 2309    metoCLOPramide (REGLAN) 10 MG tablet  Every 8 hours PRN        06/30/20 2309           Pricilla Loveless, MD 06/30/20 2313

## 2020-06-30 NOTE — ED Triage Notes (Signed)
Pt arrives EMS with c/o vomiting since Saturday. Seen and treated at [facility], and UC today. Given RX for zofran today and not helping. Last dose at 1800 tonight.

## 2020-07-01 ENCOUNTER — Other Ambulatory Visit: Payer: Self-pay

## 2020-07-01 ENCOUNTER — Encounter (HOSPITAL_COMMUNITY): Payer: Self-pay

## 2020-07-01 DIAGNOSIS — Z5321 Procedure and treatment not carried out due to patient leaving prior to being seen by health care provider: Secondary | ICD-10-CM | POA: Diagnosis not present

## 2020-07-01 DIAGNOSIS — R1110 Vomiting, unspecified: Secondary | ICD-10-CM | POA: Diagnosis present

## 2020-07-01 LAB — COMPREHENSIVE METABOLIC PANEL
ALT: 19 U/L (ref 0–44)
AST: 22 U/L (ref 15–41)
Albumin: 3.8 g/dL (ref 3.5–5.0)
Alkaline Phosphatase: 52 U/L (ref 38–126)
Anion gap: 10 (ref 5–15)
BUN: 11 mg/dL (ref 6–20)
CO2: 25 mmol/L (ref 22–32)
Calcium: 9.1 mg/dL (ref 8.9–10.3)
Chloride: 102 mmol/L (ref 98–111)
Creatinine, Ser: 0.87 mg/dL (ref 0.44–1.00)
GFR, Estimated: >60 mL/min (ref >60)
Glucose, Bld: 95 mg/dL (ref 70–99)
Potassium: 2.9 mmol/L — ABNORMAL LOW (ref 3.5–5.1)
Sodium: 137 mmol/L (ref 135–145)
Total Bilirubin: 1.1 mg/dL (ref 0.3–1.2)
Total Protein: 7.4 g/dL (ref 6.5–8.1)

## 2020-07-01 LAB — CBC
HCT: 32.5 % — ABNORMAL LOW (ref 36.0–46.0)
Hemoglobin: 10.4 g/dL — ABNORMAL LOW (ref 12.0–15.0)
MCH: 22.9 pg — ABNORMAL LOW (ref 26.0–34.0)
MCHC: 32 g/dL (ref 30.0–36.0)
MCV: 71.4 fL — ABNORMAL LOW (ref 80.0–100.0)
Platelets: 286 10*3/uL (ref 150–400)
RBC: 4.55 MIL/uL (ref 3.87–5.11)
RDW: 18.4 % — ABNORMAL HIGH (ref 11.5–15.5)
WBC: 9.1 10*3/uL (ref 4.0–10.5)
nRBC: 0 % (ref 0.0–0.2)

## 2020-07-01 LAB — URINALYSIS, ROUTINE W REFLEX MICROSCOPIC
Bilirubin Urine: NEGATIVE
Glucose, UA: NEGATIVE mg/dL
Hgb urine dipstick: NEGATIVE
Ketones, ur: 80 mg/dL — AB
Leukocytes,Ua: NEGATIVE
Nitrite: NEGATIVE
Protein, ur: 30 mg/dL — AB
Specific Gravity, Urine: 1.025 (ref 1.005–1.030)
pH: 6 (ref 5.0–8.0)

## 2020-07-01 LAB — I-STAT BETA HCG BLOOD, ED (MC, WL, AP ONLY): I-stat hCG, quantitative: <5 m[IU]/mL (ref <5)

## 2020-07-01 LAB — LIPASE, BLOOD: Lipase: 32 U/L (ref 11–51)

## 2020-07-01 NOTE — ED Notes (Signed)
Pt provided w/labeled specimen cup for urine collection. ENMiles 

## 2020-07-01 NOTE — ED Triage Notes (Signed)
Pt arrives EMS with c/o recurrent vomiting since Saturday. Seen multiple times for same.

## 2020-07-02 ENCOUNTER — Emergency Department (HOSPITAL_COMMUNITY)
Admission: EM | Admit: 2020-07-02 | Discharge: 2020-07-02 | Disposition: A | Discharge status: Home / Self Care | Payer: Medicaid Other | Attending: Emergency Medicine | Admitting: Emergency Medicine

## 2020-07-02 NOTE — ED Notes (Signed)
Pt called 3x for room placement. Eloped from waiting area.  

## 2020-07-29 ENCOUNTER — Other Ambulatory Visit: Payer: Self-pay

## 2020-07-29 ENCOUNTER — Ambulatory Visit: Payer: Medicaid Other | Attending: Family Medicine | Admitting: Family Medicine

## 2020-07-29 ENCOUNTER — Encounter: Payer: Self-pay | Admitting: Family Medicine

## 2020-07-29 VITALS — BP 114/72 | HR 93 | Ht 62.0 in | Wt 159.6 lb

## 2020-07-29 DIAGNOSIS — E876 Hypokalemia: Secondary | ICD-10-CM

## 2020-07-29 DIAGNOSIS — R1115 Cyclical vomiting syndrome unrelated to migraine: Secondary | ICD-10-CM

## 2020-07-29 DIAGNOSIS — D649 Anemia, unspecified: Secondary | ICD-10-CM | POA: Diagnosis not present

## 2020-07-29 MED ORDER — METOCLOPRAMIDE HCL 10 MG PO TABS: 10.0000 mg | ORAL_TABLET | Freq: Three times a day (TID) | ORAL | 2 refills | Status: AC | PRN
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
## 2020-07-29 NOTE — Patient Instructions (Signed)
Vomiting, Adult Vomiting occurs when stomach contents are thrown up and out of the mouth. Many people notice nausea before vomiting. Vomiting can make you feel weak and cause you to become dehydrated. Dehydration can make you feel tired and thirsty, cause you to have a dry mouth, and decrease how often you urinate. Older adults and people who have other diseases or a weak body defense system (immune system) are at higher risk for dehydration. It is important to treat vomiting as told by your health care provider. Follow these instructions at home: Eating and drinking Follow these recommendations as told by your health care provider:  Take an oral rehydration solution (ORS). This is a drink that is sold at pharmacies and retail stores.  Eat bland, easy-to-digest foods in small amounts as you are able. These foods include bananas, applesauce, rice, lean meats, toast, and crackers.  Drink clear fluids slowly and in small amounts as you are able. Clear fluids include water, ice chips, low-calorie sports drinks, and fruit juice that has water added (diluted fruit juice).  Avoid drinking fluids that contain a lot of sugar or caffeine, such as energy drinks, sports drinks, and soda.  Avoid alcohol.  Avoid spicy or fatty foods.      General instructions  Wash your hands often using soap and water. If soap and water are not available, use hand sanitizer. Make sure that everyone in your household washes their hands frequently.  Take over-the-counter and prescription medicines only as told by your health care provider.  Rest at home while you recover.  Watch your condition for any changes.  Keep all follow-up visits as told by your health care provider. This is important.   Contact a health care provider if:  Your vomiting gets worse.  You have new symptoms.  You have a fever.  You cannot drink fluids without vomiting.  You feel light-headed or dizzy.  You have a headache.  You have  muscle cramps.  You have a rash.  You have pain while urinating. Get help right away if:  You have pain in your chest, neck, arm, or jaw.  You feel extremely weak or you faint.  You have persistent vomiting.  You have vomit that is bright red or looks like black coffee grounds.  You have stools that are bloody or black, or stools that look like tar.  You have a severe headache, a stiff neck, or both.  You have severe pain, cramping, or bloating in your abdomen.  You have trouble breathing or you are breathing very quickly.  Your heart is beating very quickly.  Your skin feels cold and clammy.  You feel confused.  You have signs of dehydration, such as: ? Dark urine, very little urine, or no urine. ? Cracked lips. ? Dry mouth. ? Sunken eyes. ? Sleepiness. ? Weakness. These symptoms may represent a serious problem that is an emergency. Do not wait to see if the symptoms will go away. Get medical help right away. Call your local emergency services (911 in the [place] Do not drive yourself to the hospital. Summary  Vomiting occurs when stomach contents are thrown up and out of the mouth. Vomiting can cause you to become dehydrated. Older adults and people who have other diseases or a weak immune system are at higher risk for dehydration.  It is important to treat vomiting as told by your health care provider. Follow your health care provider's instructions about eating and drinking.  Wash your hands often   using soap and water. If soap and water are not available, use hand sanitizer. Make sure that everyone in your household washes their hands frequently.  Watch your condition for any changes and for signs of dehydration.  Keep all follow-up visits as told by your health care provider. This is important. This information is not intended to replace advice given to you by your health care provider. Make sure you discuss any questions you have with your health care  provider. Document Revised: 09/28/2018 Document Reviewed: 09/19/2017 Elsevier Patient Education  2021 Elsevier Inc.  

## 2020-07-30 ENCOUNTER — Other Ambulatory Visit: Payer: Self-pay | Admitting: Family Medicine

## 2020-07-30 LAB — CBC WITH DIFFERENTIAL/PLATELET
Basophils Absolute: 0.1 10*3/uL (ref 0.0–0.2)
Basos: 1 %
EOS (ABSOLUTE): 0 10*3/uL (ref 0.0–0.4)
Eos: 0 %
Hematocrit: 32.2 % — ABNORMAL LOW (ref 34.0–46.6)
Hemoglobin: 9.9 g/dL — ABNORMAL LOW (ref 11.1–15.9)
Immature Grans (Abs): 0 10*3/uL (ref 0.0–0.1)
Immature Granulocytes: 0 %
Lymphocytes Absolute: 3 10*3/uL (ref 0.7–3.1)
Lymphs: 33 %
MCH: 23 pg — ABNORMAL LOW (ref 26.6–33.0)
MCHC: 30.7 g/dL — ABNORMAL LOW (ref 31.5–35.7)
MCV: 75 fL — ABNORMAL LOW (ref 79–97)
Monocytes Absolute: 0.7 10*3/uL (ref 0.1–0.9)
Monocytes: 7 %
Neutrophils Absolute: 5.5 10*3/uL (ref 1.4–7.0)
Neutrophils: 59 %
Platelets: 286 10*3/uL (ref 150–450)
RBC: 4.31 x10E6/uL (ref 3.77–5.28)
RDW: 18.2 % — ABNORMAL HIGH (ref 11.7–15.4)
WBC: 9.3 10*3/uL (ref 3.4–10.8)

## 2020-07-30 LAB — BASIC METABOLIC PANEL
BUN/Creatinine Ratio: 16 (ref 9–23)
BUN: 11 mg/dL (ref 6–20)
CO2: 23 mmol/L (ref 20–29)
Calcium: 9.4 mg/dL (ref 8.7–10.2)
Chloride: 102 mmol/L (ref 96–106)
Creatinine, Ser: 0.7 mg/dL (ref 0.57–1.00)
Glucose: 95 mg/dL (ref 65–99)
Potassium: 3.8 mmol/L (ref 3.5–5.2)
Sodium: 140 mmol/L (ref 134–144)
eGFR: 120 mL/min/{1.73_m2} (ref >59)

## 2020-07-30 MED ORDER — IRON (FERROUS SULFATE) 325 (65 FE) MG PO TABS: 325.0000 mg | ORAL_TABLET | Freq: Every day | ORAL | 2 refills | Status: AC

## 2020-09-16 ENCOUNTER — Encounter: Payer: Self-pay | Admitting: *Deleted

## 2023-01-13 IMAGING — CT CT ABD-PELV W/ CM
2 of 4 series · 16 of 46 positions shown, 18 images · IV contrast (omnipaque)
Comparison: None.

CLINICAL DATA: Nausea and vomiting.  Abdominal pain.

EXAM:
CT ABDOMEN AND PELVIS WITH CONTRAST
TECHNIQUE: Multidetector CT imaging of the abdomen and pelvis was performed
using the standard protocol following bolus administration of
intravenous contrast.
CONTRAST:  100mL OMNIPAQUE IOHEXOL 300 MG/ML  SOLN

[Series 2: axial st · axial · 0.75mm/px · z∈[+948,+1338]mm · 13 of 90 slices shown, 15 images]
[im 6/90  soft-tissue]
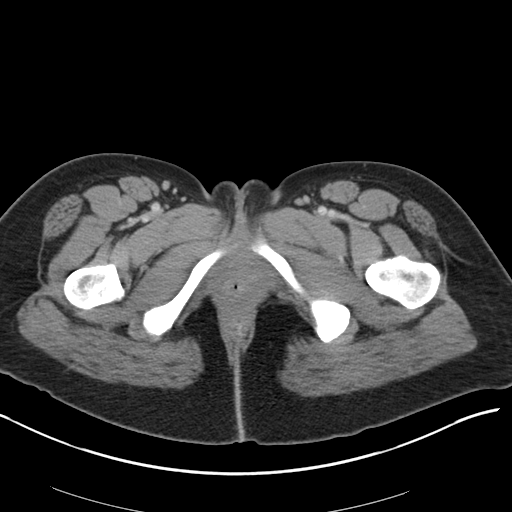
[im 6/90  bone]
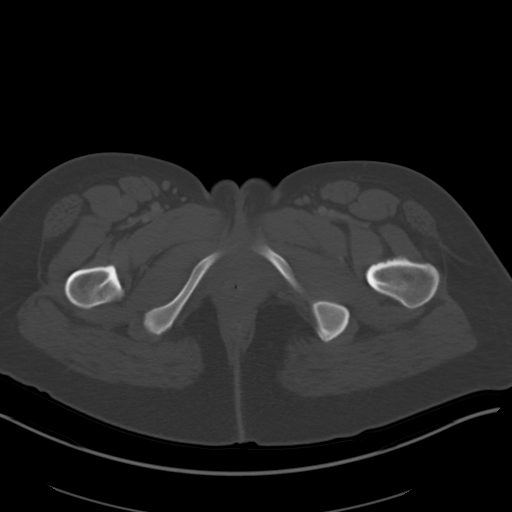
[im 11/90  soft-tissue]
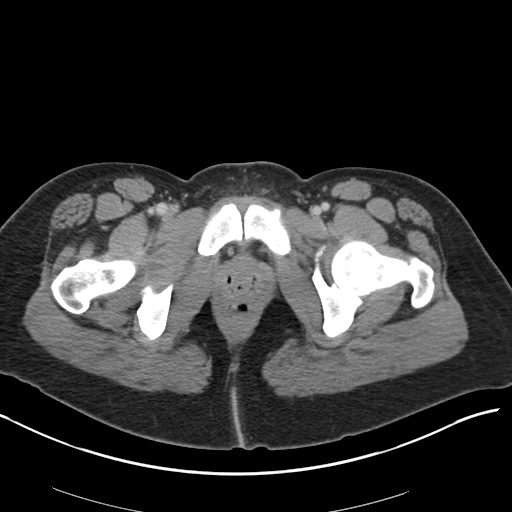
[im 21/90  soft-tissue]
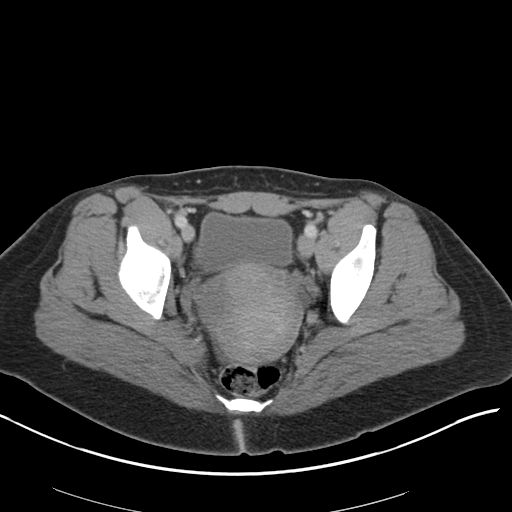
[im 27/90  soft-tissue]
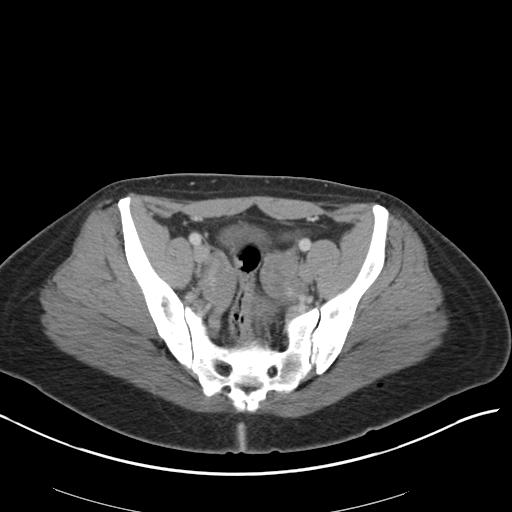
[im 32/90  soft-tissue]
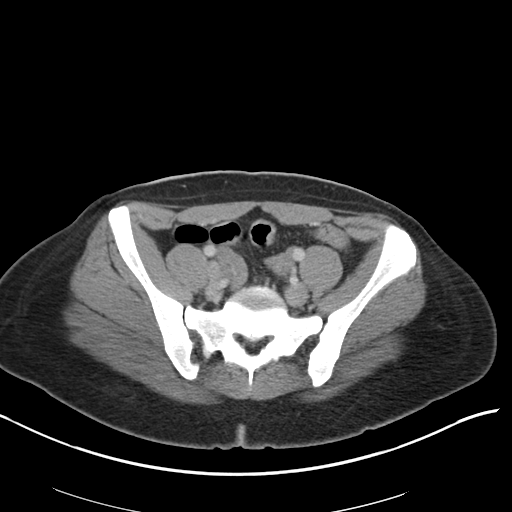
[im 37/90  soft-tissue]
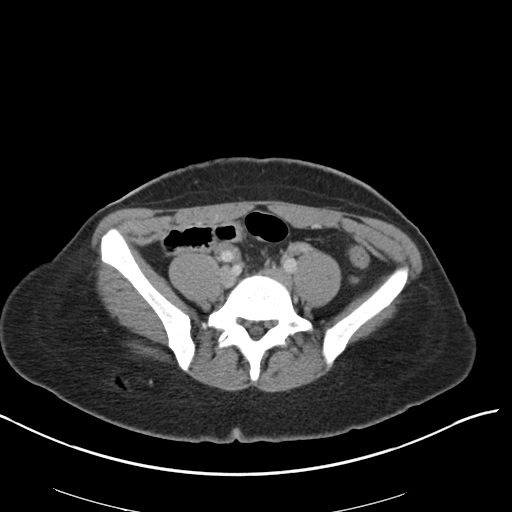
[im 48/90  soft-tissue]
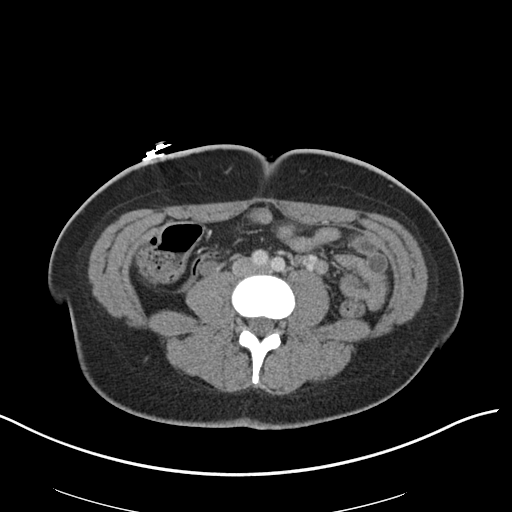
[im 53/90  soft-tissue]
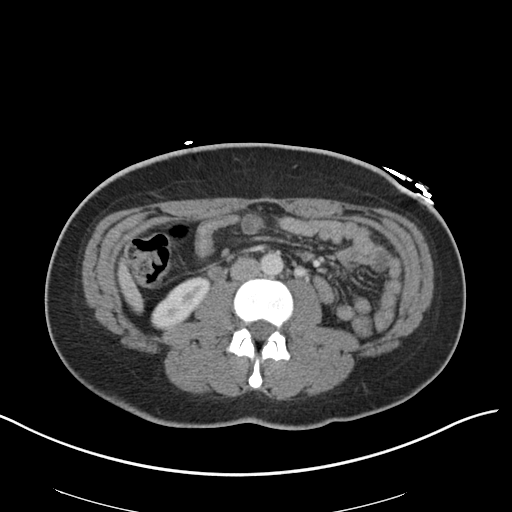
[im 58/90  soft-tissue]
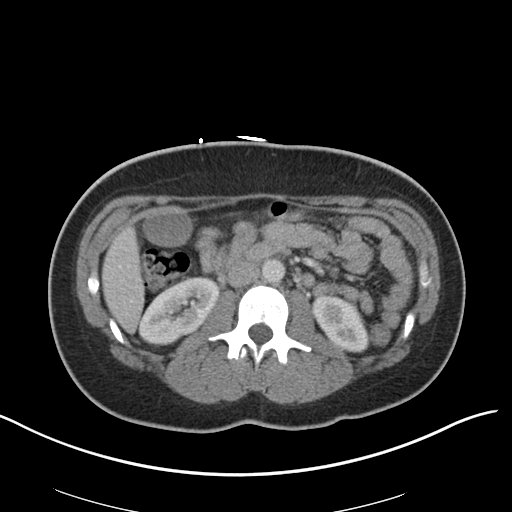
[im 58/90  bone]
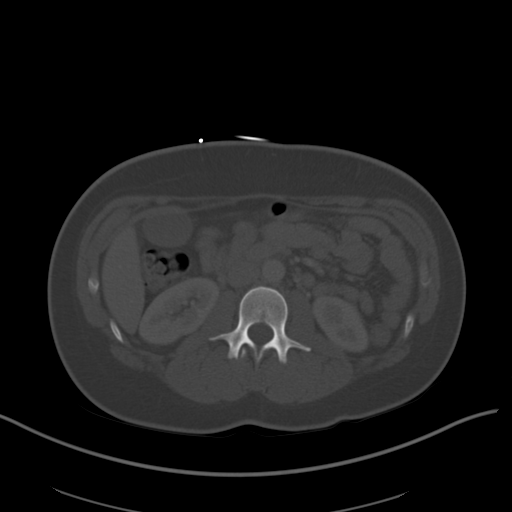
[im 63/90  soft-tissue]
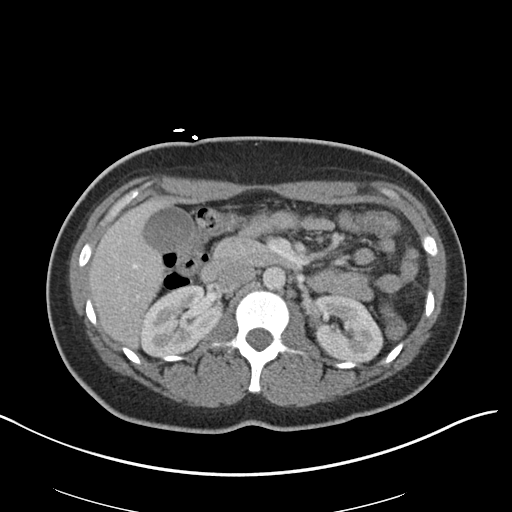
[im 69/90  soft-tissue]
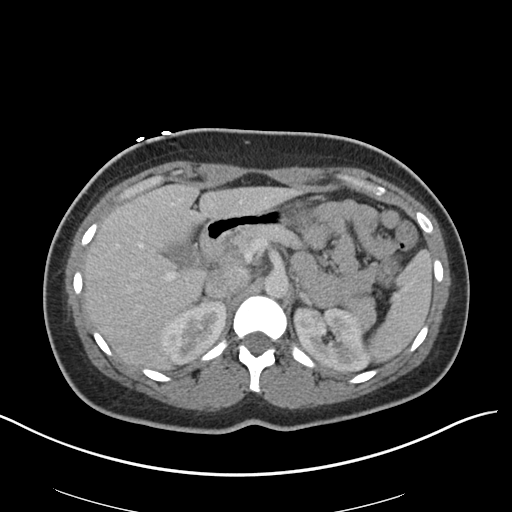
[im 79/90  soft-tissue]
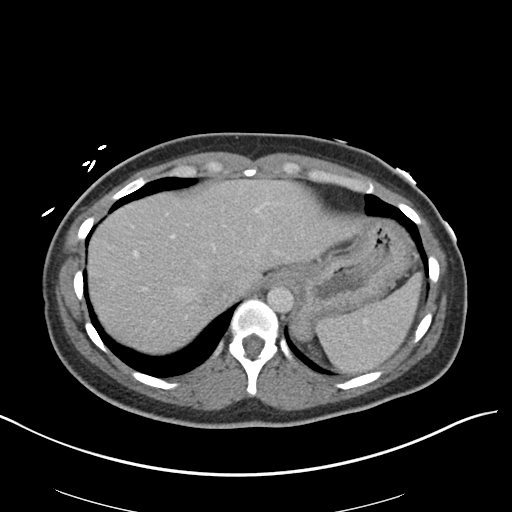
[im 84/90  soft-tissue]
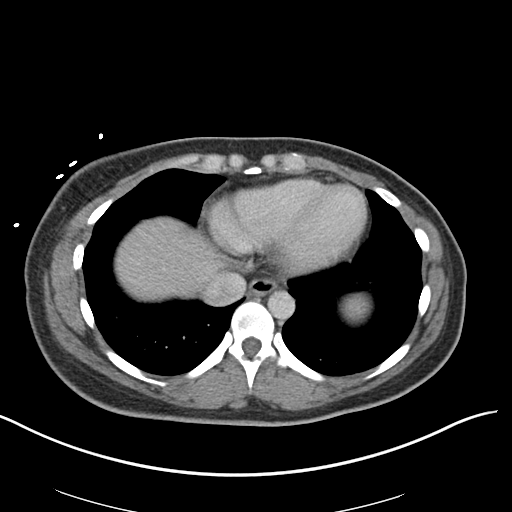

[Series 4: coronal st · coronal · 0.70mm/px · 3 of 121 slices shown]
[im 41/121  soft-tissue]
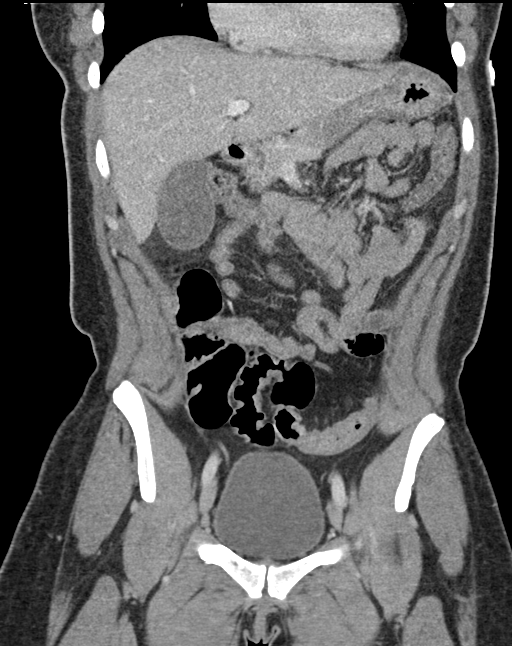
[im 54/121  soft-tissue]
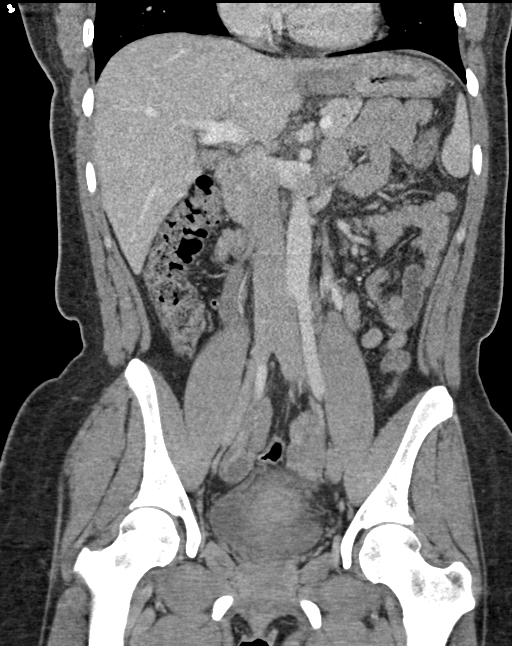
[im 67/121  soft-tissue]
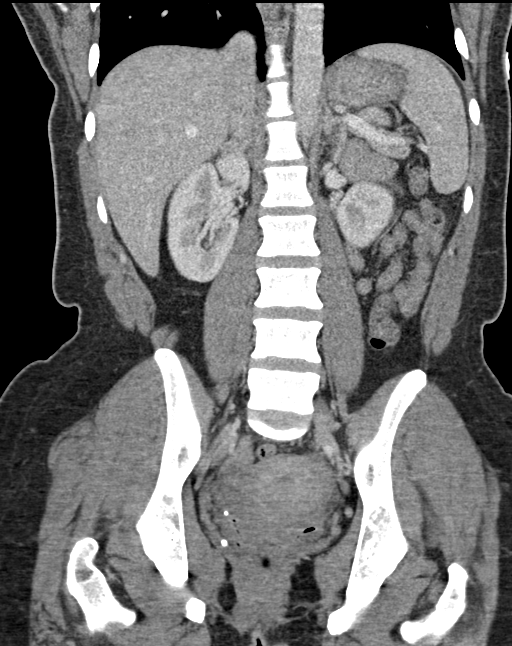

[16 of 46 positions shown; findings below may reference images not displayed]

FINDINGS: Lower chest: Lung bases are clear.

Hepatobiliary: Minimal focal fatty infiltration adjacent to the
falciform ligament. No focal hepatic lesion. Gallbladder
physiologically distended, no calcified stone. No biliary
dilatation.

Pancreas: No ductal dilatation or inflammation.

Spleen: Normal in size without focal abnormality.

Adrenals/Urinary Tract: Normal adrenal glands. No hydronephrosis,
renal calculi, or perinephric edema. Minimal focal cortical scarring
in the upper most right kidney. Unremarkable urinary bladder.

Stomach/Bowel: Decompressed stomach. No small bowel obstruction or
inflammation. Normal appendix. Small volume of stool in the proximal
colon. More distal colon is decompressed limiting assessment for
wall thickening. There is no pericolonic edema.

Vascular/Lymphatic: Normal caliber abdominal aorta. Patent portal
vein. No acute vascular findings. No enlarged lymph nodes in the
abdomen or pelvis.

Reproductive: Retroverted uterus. Ovaries are symmetric in size. No
suspicious adnexal mass. Small amount of free fluid in the pelvis is
likely physiologic.

Other: No upper abdominal ascites. No free air or focal fluid
collection.

Musculoskeletal: There are no acute or suspicious osseous
abnormalities.
IMPRESSION: No acute abnormality in the abdomen/pelvis or explanation for
abdominal pain.

## 2023-01-13 IMAGING — DX DG CHEST 1V PORT
1 series · 1 of 1 positions shown · non-contrast
Comparison: None.

CLINICAL DATA: Chest pain, vomiting

EXAM:
PORTABLE CHEST 1 VIEW

[chest ap]
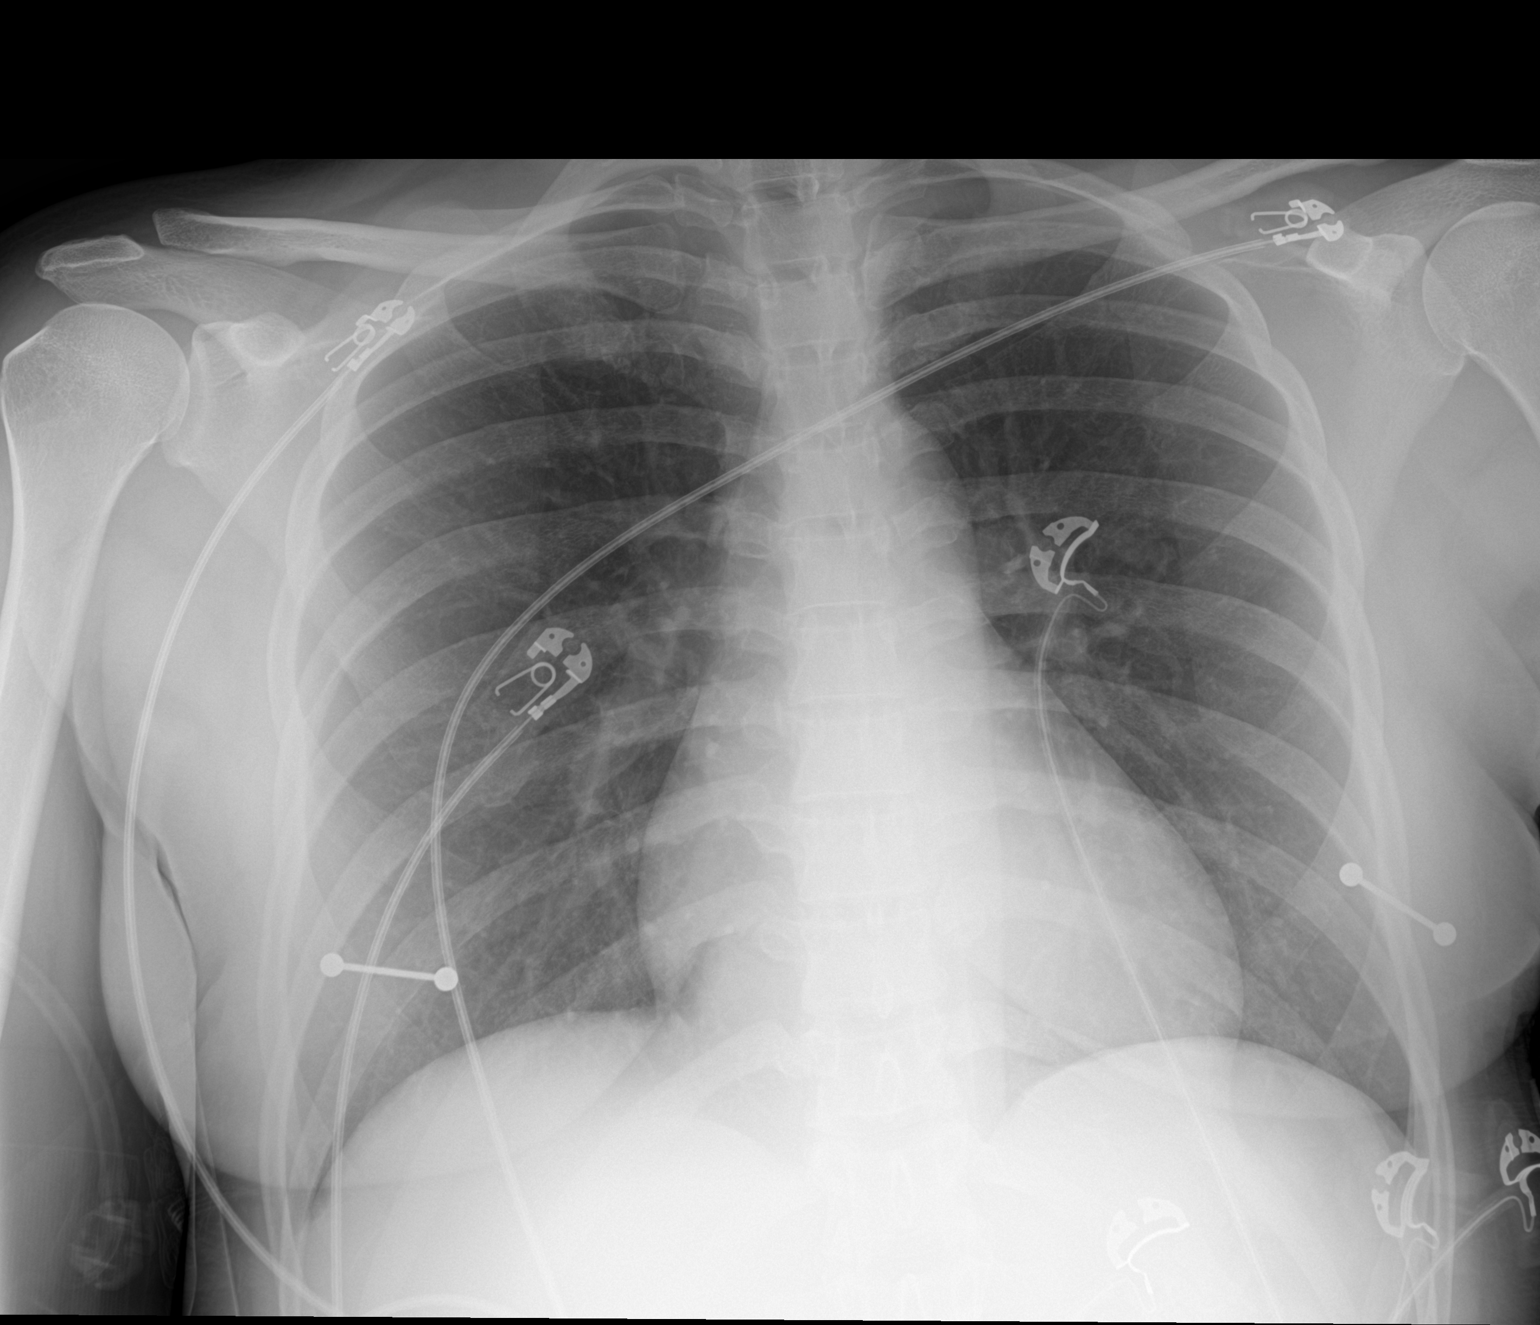

[1 of 1 positions shown; findings below may reference images not displayed]

FINDINGS: The heart size and mediastinal contours are within normal limits.
Both lungs are clear. The visualized skeletal structures are
unremarkable.
IMPRESSION: No active disease.
